# Patient Record
Sex: Male | Born: 1970
Health system: Southern US, Community
[De-identification: ages and names within clinical notes are randomized; demographics above are authoritative.]

## PROBLEM LIST (undated history)

## (undated) DIAGNOSIS — G43909 Migraine, unspecified, not intractable, without status migrainosus: Secondary | ICD-10-CM

## (undated) DIAGNOSIS — I10 Essential (primary) hypertension: Secondary | ICD-10-CM

## (undated) DIAGNOSIS — K649 Unspecified hemorrhoids: Secondary | ICD-10-CM

## (undated) DIAGNOSIS — E785 Hyperlipidemia, unspecified: Secondary | ICD-10-CM

## (undated) DIAGNOSIS — F419 Anxiety disorder, unspecified: Secondary | ICD-10-CM

## (undated) DIAGNOSIS — T7840XA Allergy, unspecified, initial encounter: Secondary | ICD-10-CM

## (undated) HISTORY — DX: Allergy, unspecified, initial encounter: T78.40XA

## (undated) HISTORY — PX: HEMORRHOID BANDING: SHX5850

## (undated) HISTORY — DX: Essential (primary) hypertension: I10

## (undated) HISTORY — DX: Unspecified hemorrhoids: K64.9

## (undated) HISTORY — PX: OTHER SURGICAL HISTORY: SHX169

## (undated) HISTORY — DX: Hyperlipidemia, unspecified: E78.5

## (undated) HISTORY — DX: Anxiety disorder, unspecified: F41.9

## (undated) HISTORY — PX: COLONOSCOPY: SHX174

## (undated) HISTORY — DX: Migraine, unspecified, not intractable, without status migrainosus: G43.909

---

## 2012-09-09 ENCOUNTER — Ambulatory Visit (INDEPENDENT_AMBULATORY_CARE_PROVIDER_SITE_OTHER): Payer: 59 | Admitting: Family Medicine

## 2012-09-09 ENCOUNTER — Encounter: Payer: Self-pay | Admitting: Family Medicine

## 2012-09-09 VITALS — BP 140/100 | HR 68 | Temp 98.3°F | Ht 70.0 in | Wt 189.0 lb

## 2012-09-09 DIAGNOSIS — Z8249 Family history of ischemic heart disease and other diseases of the circulatory system: Secondary | ICD-10-CM

## 2012-09-09 DIAGNOSIS — E785 Hyperlipidemia, unspecified: Secondary | ICD-10-CM | POA: Insufficient documentation

## 2012-09-09 DIAGNOSIS — I10 Essential (primary) hypertension: Secondary | ICD-10-CM

## 2012-09-09 DIAGNOSIS — G43909 Migraine, unspecified, not intractable, without status migrainosus: Secondary | ICD-10-CM

## 2012-09-09 LAB — COMPREHENSIVE METABOLIC PANEL
ALT: 32 U/L (ref 0–53)
CO2: 28 mEq/L (ref 19–32)
Calcium: 9.5 mg/dL (ref 8.4–10.5)
Chloride: 96 mEq/L (ref 96–112)
Creatinine, Ser: 1.3 mg/dL (ref 0.4–1.5)
GFR: 80.81 mL/min (ref >60.00)
Glucose, Bld: 78 mg/dL (ref 70–99)
Total Bilirubin: 0.9 mg/dL (ref 0.3–1.2)

## 2012-09-09 MED ORDER — LISINOPRIL-HYDROCHLOROTHIAZIDE 10-12.5 MG PO TABS: 1.0000 | ORAL_TABLET | Freq: Every day | ORAL | Status: DC

## 2012-09-09 MED ORDER — TOPIRAMATE 25 MG PO TABS: 25.0000 mg | ORAL_TABLET | Freq: Every day | ORAL | Status: DC

## 2012-09-09 NOTE — Progress Notes (Signed)
Subjective:    Patient ID: Micheal Keller, male    DOB: 08-30-1970, 42 y.o.   MRN: [id number]  HPI  Very pleasant 42 yo male here to establish care.  HTN- Has been on HCTZ 12.5 mg daily for years.  Strong FH of HTN.  Lately has been feeling like his face is more flushed and BP has been elevated. No blurred vision.  No CP or SOB.  Migraine HA- with aura, associated with nausea.  Usually gets them once a month but last month was bad- had 4 or 5 between January and February. Imitrex has never worked.  Relpax has been good abortive therapy if he starts it as soon as he notices the auro.  HLD- strong FH of HLD.  Has not had blood work in over a year. On Vytroin.  Denies any myalgias.  Brother diagnosed with WPW in his 61s.  He would like an EKG.  Denies any palpitations, CP or SOB. He is very physically active- cardio and weight training everyday.  Patient Active Problem List  Diagnosis  . Hypertension  . Hyperlipidemia  . Migraine, unspecified, without mention of intractable migraine without mention of status migrainosus  . Family history of cardiac arrhythmia   Past Medical History  Diagnosis Date  . Hypertension   . Hyperlipidemia    No past surgical history on file. History  Substance Use Topics  . Smoking status: Former Games developer  . Smokeless tobacco: Not on file  . Alcohol Use: Not on file   Family History  Problem Relation Age of Onset  . Diabetes Mother   . Hypertension Mother   . Hyperlipidemia Father   . Evelene Croon Parkinson White syndrome Brother   . Bone cancer Sister    No Known Allergies No current outpatient prescriptions on file prior to visit.   No current facility-administered medications on file prior to visit.   The PMH, PSH, Social History, Family History, Medications, and allergies have been reviewed in [facility], and have been updated if relevant.   Review of Systems See HPI Patient reports no  vision/ hearing changes,anorexia, weight change, fever ,adenopathy,  persistant / recurrent hoarseness, swallowing issues, chest pain, edema,persistant / recurrent cough, hemoptysis, dyspnea(rest, exertional, paroxysmal nocturnal)    Objective:   Physical Exam BP 140/100  Pulse 68  Temp(Src) 98.3 F (36.8 C)  Ht 5\' 10"  (1.778 m)  Wt 189 lb (85.73 kg)  BMI 27.12 kg/m2 General:  Pleasant male in NAD Eyes:  PERRL Ears:  External ear exam shows no significant lesions or deformities.  Otoscopic examination reveals clear canals, tympanic membranes are intact bilaterally without bulging, retraction, inflammation or discharge. Hearing is grossly normal bilaterally. Nose:  External nasal examination shows no deformity or inflammation. Nasal mucosa are pink and moist without lesions or exudates. Mouth:  Oral mucosa and oropharynx without lesions or exudates.  Teeth in good repair. Neck:  no carotid bruit or thyromegaly no cervical or supraclavicular lymphadenopathy  Lungs:  Normal respiratory effort, chest expands symmetrically. Lungs are clear to auscultation, no crackles or wheezes. Heart:  Normal rate and regular rhythm. S1 and S2 normal without gallop, murmur, click, rub or other extra sounds. Abdomen:  Bowel sounds positive,abdomen soft and non-tender without masses, organomegaly or hernias noted. Pulses:  R and L posterior tibial pulses are full and equal bilaterally  Extremities:  no edema      Assessment & Plan:  1. Migraine, unspecified, without mention of intractable migraine without mention of status migrainosus Deteriorated.  Just  had dilated eye exam- prescription ok.  Will start on migraine prophylaxis with topamax 25 mg qhs.  Continue Relpax for abortive therapy. - CBC with Differential  2. Family history of cardiac arrhythmia EKG very reassuring- probable repolarization.  Will compare with previous EKG once we get records.   - EKG 12-Lead  3. Hyperlipidemia On Vytorin.   Check labs today. - Lipid Panel  4. Hypertension Not well controlled  and he is symptomatic.  Will add lisinopril 10 mg to HCTZ 12.5 mg daily. - Comprehensive metabolic panel

## 2012-09-09 NOTE — Patient Instructions (Addendum)
It was so nice to meet you. We are starting Topamax 25 mg nightly.   We are also starting a new blood pressure medication that actually is a combination of two medications- HCTZ 12.5 mg (what you are currently taking) with lisinopril 10 mg daily. Please come see me in 2 weeks to recheck your blood pressure.  We will call you with your lab results.

## 2012-09-13 LAB — LIPID PANEL
Cholesterol: 168 mg/dL (ref 0–200)
HDL: 48.8 mg/dL (ref >39.00)

## 2012-09-13 NOTE — Addendum Note (Signed)
Addended by: Alvina Chou on: 09/13/2012 12:41 PM   Modules accepted: Orders

## 2012-09-20 ENCOUNTER — Other Ambulatory Visit (INDEPENDENT_AMBULATORY_CARE_PROVIDER_SITE_OTHER): Payer: 59

## 2012-09-20 DIAGNOSIS — G43909 Migraine, unspecified, not intractable, without status migrainosus: Secondary | ICD-10-CM

## 2012-09-20 DIAGNOSIS — I10 Essential (primary) hypertension: Secondary | ICD-10-CM

## 2012-09-20 LAB — CBC WITH DIFFERENTIAL/PLATELET
Eosinophils Relative: 2.1 % (ref 0.0–5.0)
HCT: 40.2 % (ref 39.0–52.0)
Lymphocytes Relative: 58.6 % — ABNORMAL HIGH (ref 12.0–46.0)
Lymphs Abs: 2.8 10*3/uL (ref 0.7–4.0)
Monocytes Relative: 7.5 % (ref 3.0–12.0)
Platelets: 260 10*3/uL (ref 150.0–400.0)
WBC: 4.9 10*3/uL (ref 4.5–10.5)

## 2012-09-23 ENCOUNTER — Telehealth: Payer: Self-pay

## 2012-09-23 NOTE — Telephone Encounter (Signed)
Pt request call back when recent lab results available.

## 2012-09-24 NOTE — Telephone Encounter (Signed)
Dr. Dayton Martes, please review patient's labs.

## 2012-09-24 NOTE — Telephone Encounter (Signed)
Advised patient of results.  

## 2012-09-24 NOTE — Telephone Encounter (Signed)
CBC, cholesterol, liver function and kidney function look good.

## 2012-10-07 ENCOUNTER — Encounter: Payer: Self-pay | Admitting: Family Medicine

## 2012-10-07 ENCOUNTER — Ambulatory Visit (INDEPENDENT_AMBULATORY_CARE_PROVIDER_SITE_OTHER): Payer: 59 | Admitting: Family Medicine

## 2012-10-07 VITALS — BP 130/70 | HR 60 | Temp 98.1°F | Ht 70.0 in | Wt 185.0 lb

## 2012-10-07 DIAGNOSIS — G43909 Migraine, unspecified, not intractable, without status migrainosus: Secondary | ICD-10-CM

## 2012-10-07 DIAGNOSIS — I10 Essential (primary) hypertension: Secondary | ICD-10-CM

## 2012-10-07 MED ORDER — TOPIRAMATE 25 MG PO TABS: 25.0000 mg | ORAL_TABLET | Freq: Every day | ORAL | Status: DC

## 2012-10-07 NOTE — Progress Notes (Signed)
Subjective:    Patient ID: Micheal Keller, male    DOB: [birth year], 42 y.o.   MRN: [id number]  HPI  Very pleasant 42 yo male here for follow up.  HTN- Had been on HCTZ 12.5 mg daily for years.  Strong FH of HTN.  When he established care with me last month, BP not well controlled and he was symptomatic.  BP Readings from Last 3 Encounters:  09/09/12 140/100   Therefore, we added lisinopril 10 mg to HCTZ 12.5 mg daily.  He feels much better, no longer feels facial flushing.  Normotensive today.  Migraine HA- with aura, associated with nausea. Was getting one migraine per month but when he established care with me last month, had increased frequency-  had 4 or 5 between January and February.   Relpax has been good abortive therapy if he starts it as soon as he notices the auro. Started topamax 25 mg qhs for abortive therapy last month and he has not had a migraine since!  No adverse rx noted.   Patient Active Problem List  Diagnosis  . Hypertension  . Hyperlipidemia  . Migraine, unspecified, without mention of intractable migraine without mention of status migrainosus  . Family history of cardiac arrhythmia   Past Medical History  Diagnosis Date  . Hypertension   . Hyperlipidemia    No past surgical history on file. History  Substance Use Topics  . Smoking status: Former Games developer  . Smokeless tobacco: Not on file  . Alcohol Use: Not on file   Family History  Problem Relation Age of Onset  . Diabetes Mother   . Hypertension Mother   . Hyperlipidemia Father   . Evelene Croon Parkinson White syndrome Brother   . Bone cancer Sister    No Known Allergies Current Outpatient Prescriptions on File Prior to Visit  Medication Sig Dispense Refill  . eletriptan (RELPAX) 40 MG tablet One tablet by mouth at onset of headache. May repeat in 2 hours if headache persists or recurs. may repeat in 2 hours if necessary      . ezetimibe-simvastatin (VYTORIN) 10-10 MG per tablet Take 1 tablet by mouth at  bedtime.      Marland Kitchen lisinopril-hydrochlorothiazide (PRINZIDE,ZESTORETIC) 10-12.5 MG per tablet Take 1 tablet by mouth daily.  90 tablet  3  . promethazine (PHENERGAN) 25 MG tablet Take 25 mg by mouth every 6 (six) hours as needed for nausea.      Marland Kitchen topiramate (TOPAMAX) 25 MG tablet Take 1 tablet (25 mg total) by mouth at bedtime.  30 tablet  1   No current facility-administered medications on file prior to visit.   The PMH, PSH, Social History, Family History, Medications, and allergies have been reviewed in [facility], and have been updated if relevant.   Review of Systems See HPI Patient reports no  vision/ hearing changes,anorexia, weight change, fever ,adenopathy, persistant / recurrent hoarseness, swallowing issues, chest pain, edema,persistant / recurrent cough, hemoptysis, dyspnea(rest, exertional, paroxysmal nocturnal)    Objective:   Physical Exam BP 130/70  Pulse 60  Temp(Src) 98.1 F (36.7 C) (Oral)  Ht 5\' 10"  (1.778 m)  Wt 185 lb (83.915 kg)  BMI 26.54 kg/m2  SpO2 98% General:  Pleasant male in NAD Eyes:  PERRL Mouth:  Oral mucosa and oropharynx without lesions or exudates.  Teeth in good repair. Neck:  no carotid bruit or thyromegaly no cervical or supraclavicular lymphadenopathy  Lungs:  Normal respiratory effort, chest expands symmetrically. Lungs are clear to auscultation, no crackles  or wheezes. Heart:  Normal rate and regular rhythm. S1 and S2 normal without gallop, murmur, click, rub or other extra sounds. Pulses:  R and L posterior tibial pulses are full and equal bilaterally  Extremities:  no edema      Assessment & Plan:  1. Hypertension Well controlled now on two agents. Rx refilled.  2. Migraine, unspecified, without mention of intractable migraine without mention of status migrainosus Well controlled with topamax for prophylaxis.  Continue current dose. Rx refilled.

## 2012-10-07 NOTE — Patient Instructions (Addendum)
Great to see you. I will call Alfredo Bach for old your old EKG. Continue current medications.

## 2012-10-08 ENCOUNTER — Telehealth: Payer: Self-pay | Admitting: *Deleted

## 2012-10-08 NOTE — Telephone Encounter (Signed)
Message copied by Eliezer Bottom on Fri Oct 08, 2012  4:13 PM ------      Message from: Dianne Dun      Created: Fri Oct 08, 2012 11:34 AM       Please call pt to let him know that I reviewed his EKG from [place].  Does not looked changed from the one we did here which is good news. ------

## 2012-10-08 NOTE — Telephone Encounter (Signed)
Advised patient

## 2012-11-17 ENCOUNTER — Ambulatory Visit (INDEPENDENT_AMBULATORY_CARE_PROVIDER_SITE_OTHER): Payer: 59 | Admitting: Family Medicine

## 2012-11-17 ENCOUNTER — Encounter: Payer: Self-pay | Admitting: Family Medicine

## 2012-11-17 VITALS — BP 130/84 | HR 60 | Temp 98.0°F | Wt 185.0 lb

## 2012-11-17 DIAGNOSIS — L290 Pruritus ani: Secondary | ICD-10-CM

## 2012-11-17 MED ORDER — HYDROCORTISONE ACETATE 25 MG RE SUPP: 25.0000 mg | Freq: Every evening | RECTAL | Status: DC | PRN

## 2012-11-17 NOTE — Progress Notes (Signed)
  Subjective:    Patient ID: Micheal Keller, male    DOB: [birth year], 42 y.o.   MRN: [id number]  HPI  Very pleasant male here for anal itching x 1.5 months.  No rectal bleeding. No changes in bowel habits- no constipation or diarrhea.  He does lift weights.  No contact with small children.  No recent international travel.  Symptoms are intermittent.  Patient Active Problem List   Diagnosis Date Noted  . Migraine, unspecified, without mention of intractable migraine without mention of status migrainosus 09/09/2012  . Family history of cardiac arrhythmia 09/09/2012  . Hypertension   . Hyperlipidemia    Past Medical History  Diagnosis Date  . Hypertension   . Hyperlipidemia    No past surgical history on file. History  Substance Use Topics  . Smoking status: Former Games developer  . Smokeless tobacco: Not on file  . Alcohol Use: Not on file   Family History  Problem Relation Age of Onset  . Diabetes Mother   . Hypertension Mother   . Hyperlipidemia Father   . Evelene Croon Parkinson White syndrome Brother   . Bone cancer Sister    No Known Allergies Current Outpatient Prescriptions on File Prior to Visit  Medication Sig Dispense Refill  . eletriptan (RELPAX) 40 MG tablet One tablet by mouth at onset of headache. May repeat in 2 hours if headache persists or recurs. may repeat in 2 hours if necessary      . ezetimibe-simvastatin (VYTORIN) 10-10 MG per tablet Take 1 tablet by mouth at bedtime.      Marland Kitchen lisinopril-hydrochlorothiazide (PRINZIDE,ZESTORETIC) 10-12.5 MG per tablet Take 1 tablet by mouth daily.  90 tablet  3  . promethazine (PHENERGAN) 25 MG tablet Take 25 mg by mouth every 6 (six) hours as needed for nausea.      Marland Kitchen topiramate (TOPAMAX) 25 MG tablet Take 1 tablet (25 mg total) by mouth at bedtime.  90 tablet  6   No current facility-administered medications on file prior to visit.   The PMH, PSH, Social History, Family History, Medications, and allergies have been reviewed in [facility],  and have been updated if relevant.   Review of Systems No abdominal pain No rectal pain    Objective:   Physical Exam BP 130/84  Pulse 60  Temp(Src) 98 F (36.7 C)  Wt 185 lb (83.915 kg)  BMI 26.54 kg/m2  General:  Pleasant male, NAD Abdomen:  Bowel sounds positive,abdomen soft and non-tender without masses, organomegaly or hernias noted. Rectal: normal tone, external hemorrhoid, 12:00 o'clock, non thrombosed Pulses:  R and L posterior tibial pulses are full and equal bilaterally  Extremities:  no edema       Assessment & Plan:  1. Anal itching Likely due to external, non thrombosed hemorrhoid. Given duration of symptoms, will treat with anusol suppositories, sitz baths x 10 days. Will call me with an update. Call or return to clinic prn if these symptoms worsen or fail to improve as anticipated. The patient indicates understanding of these issues and agrees with the plan.

## 2012-11-17 NOTE — Patient Instructions (Addendum)
Good to see you, Micheal Keller. Please use suppositories as directed- take a warm bath and place one rectally nightly for 10-12 days. Call me if no improvement.  Hemorrhoids Hemorrhoids are enlarged (dilated) veins around the rectum. There are 2 types of hemorrhoids, and the type of hemorrhoid is determined by its location. Internal hemorrhoids occur in the veins just inside the rectum.They are usually not painful, but they may bleed.However, they may poke through to the outside and become irritated and painful. External hemorrhoids involve the veins outside the anus and can be felt as a painful swelling or hard lump near the anus.They are often itchy and may crack and bleed. Sometimes clots will form in the veins. This makes them swollen and painful. These are called thrombosed hemorrhoids. CAUSES Causes of hemorrhoids include:  Pregnancy. This increases the pressure in the hemorrhoidal veins.  Constipation.  Straining to have a bowel movement.  Obesity.  Heavy lifting or other activity that caused you to strain. TREATMENT Most of the time hemorrhoids improve in 1 to 2 weeks. However, if symptoms do not seem to be getting better or if you have a lot of rectal bleeding, your caregiver may perform a procedure to help make the hemorrhoids get smaller or remove them completely.Possible treatments include:    STEROID SUPPOSITORIES  Rubber band ligation. A rubber band is placed at the base of the hemorrhoid to cut off the circulation.  Sclerotherapy. A chemical is injected to shrink the hemorrhoid.  Infrared light therapy. Tools are used to burn the hemorrhoid.  Hemorrhoidectomy. This is surgical removal of the hemorrhoid. HOME CARE INSTRUCTIONS   Increase fiber in your diet. Ask your caregiver about using fiber supplements.  Drink enough water and fluids to keep your urine clear or pale yellow.  Exercise regularly.  Go to the bathroom when you have the urge to have a bowel movement. Do  not wait.  Avoid straining to have bowel movements.  Keep the anal area dry and clean.  Only take over-the-counter or prescription medicines for pain, discomfort, or fever as directed by your caregiver. If your hemorrhoids are thrombosed:  Take warm sitz baths for 20 to 30 minutes, 3 to 4 times per day.  If the hemorrhoids are very tender and swollen, place ice packs on the area as tolerated. Using ice packs between sitz baths may be helpful. Fill a plastic bag with ice. Place a towel between the bag of ice and your skin.  Medicated creams and suppositories may be used or applied as directed.  Do not use a donut-shaped pillow or sit on the toilet for long periods. This increases blood pooling and pain. SEEK MEDICAL CARE IF:   You have increasing pain and swelling that is not controlled with your medicine.  You have uncontrolled bleeding.  You have difficulty or you are unable to have a bowel movement.  You have pain or inflammation outside the area of the hemorrhoids.  You have chills or an oral temperature above 102 F (38.9 C). MAKE SURE YOU:   Understand these instructions.  Will watch your condition.  Will get help right away if you are not doing well or get worse. Document Released: 06/20/2000 Document Revised: 09/15/2011 Document Reviewed: 06/03/2010 [facility] Patient Information 2013 [place], [place].

## 2012-12-01 ENCOUNTER — Telehealth: Payer: Self-pay | Admitting: Family Medicine

## 2012-12-01 ENCOUNTER — Encounter: Payer: Self-pay | Admitting: Internal Medicine

## 2012-12-01 DIAGNOSIS — L290 Pruritus ani: Secondary | ICD-10-CM

## 2012-12-01 NOTE — Telephone Encounter (Signed)
Advised patient.   He prefers to see someone in [place].

## 2012-12-01 NOTE — Telephone Encounter (Signed)
Appt made with Dr Leone Payor on 12/03/12 at 2:45pm. [facility]

## 2012-12-01 NOTE — Telephone Encounter (Signed)
Patient Information:  Caller Name: Micheal Keller  Phone: [phone]  Patient: Micheal, Keller  Gender: Male  DOB: Feb 06, 1971  Age: 42 Years  PCP: Micheal Keller [facility])  Office Follow Up:  Does the office need to follow up with this patient?: Yes  Instructions For The Office: Please follow up with patient for continued rectal itching after treatment.  Was seen in office on 11/17/12.  RN Note:  Patient was in on 11/17/12 for anal itching which maybe aggravated by hemorrhoids.  Patient states that he is essentially the same as he was when seen.  There may be a slight increase in rectal soreness.  No change in ability to pass stools.  He calls to see what else can be done for him.  RN stated she would forward his concerns to Dr. Dayton Keller with someone following up with him  Symptoms  Reason For Call & Symptoms: Patient call because after treatment he is not better. Is wondering if something else needs to be prescribed.   Office visit on 11/17/12  Reviewed Health History In EMR: N/A  Reviewed Medications In EMR: N/A  Reviewed Allergies In EMR: N/A  Reviewed Surgeries / Procedures: N/A  Date of Onset of Symptoms: 11/17/2012  Guideline(s) Used:  No Protocol Available - Information Only  Disposition Per Guideline:   Discuss with PCP and Callback by Nurse Today  Reason For Disposition Reached:   Nursing judgment  Advice Given:  Call Back If:  New symptoms develop  You become worse.  Patient Will Follow Care Advice:  YES

## 2012-12-01 NOTE — Telephone Encounter (Signed)
Since there is no improvement, I would recommend seeing GI at this point given duration of symptoms. Will place referral.

## 2012-12-03 ENCOUNTER — Encounter: Payer: Self-pay | Admitting: Internal Medicine

## 2012-12-03 ENCOUNTER — Ambulatory Visit (INDEPENDENT_AMBULATORY_CARE_PROVIDER_SITE_OTHER): Payer: 59 | Admitting: Internal Medicine

## 2012-12-03 VITALS — BP 118/82 | HR 80 | Ht 70.0 in | Wt 186.8 lb

## 2012-12-03 DIAGNOSIS — K648 Other hemorrhoids: Secondary | ICD-10-CM

## 2012-12-03 DIAGNOSIS — K5900 Constipation, unspecified: Secondary | ICD-10-CM

## 2012-12-03 DIAGNOSIS — L290 Pruritus ani: Secondary | ICD-10-CM

## 2012-12-03 NOTE — Patient Instructions (Addendum)
We have set up an appointment to come back on December 16 2012 at 4:15pm for the hemorrhoid banding.  Start Fiber, we have given you an information sheet on that today.  Start Balneol for the rectal itching.  This is over the counter.  [facility] has this.  I appreciate the opportunity to care for you.

## 2012-12-03 NOTE — Progress Notes (Signed)
Subjective:  Referred by: Dianne Dun, MD   Patient ID: [name], male    DOB: March 20, 1971, 42 y.o.   MRN: [id number]  HPI Is a very nice middle-aged [place] American man has had a 1-2 month history of anal itching. He saw his primary care physician and she saw what she thought were external hemorrhoids and prescribed hydrocortisone suppositories. He had previously tried Preparation H which did not help. He does have constipation, Intermittent defecation with straining at times, he will get the urge to defecate but cannot produce a stool so he'll wait up to 15 minutes on the toilet until he can. He does not notice anything bulging or dropping such as prolapsing hemorrhoids and he does not have rectal bleeding. He does see spots of mucoid material in his underwear in the anal area He is a weight lifter.  No Known Allergies Outpatient Prescriptions Prior to Visit  Medication Sig Dispense Refill  . eletriptan (RELPAX) 40 MG tablet One tablet by mouth at onset of headache. May repeat in 2 hours if headache persists or recurs. may repeat in 2 hours if necessary      . ezetimibe-simvastatin (VYTORIN) 10-10 MG per tablet Take 1 tablet by mouth at bedtime.      Marland Kitchen lisinopril-hydrochlorothiazide (PRINZIDE,ZESTORETIC) 10-12.5 MG per tablet Take 1 tablet by mouth daily.  90 tablet  3  . promethazine (PHENERGAN) 25 MG tablet Take 25 mg by mouth every 6 (six) hours as needed for nausea.      Marland Kitchen topiramate (TOPAMAX) 25 MG tablet Take 1 tablet (25 mg total) by mouth at bedtime.  90 tablet  6  . hydrocortisone (ANUSOL-HC) 25 MG suppository Place 1 suppository (25 mg total) rectally at bedtime as needed for hemorrhoids.  12 suppository  0   No facility-administered medications prior to visit.   Past Medical History  Diagnosis Date  . Hypertension   . Hyperlipidemia   . Migraines   . Hemorrhoids    History reviewed. No pertinent past surgical history. History   Social History  . Marital Status: Married                  Social History Main Topics  . Smoking status: Former Games developer  . Smokeless tobacco: Never Used  . Alcohol Use: Yes     Comment: occasionally  . Drug Use: No   Family History  Problem Relation Age of Onset  . Diabetes Mother   . Hypertension Mother   . Hyperlipidemia Father   . Evelene Croon Parkinson White syndrome Brother   . Bone cancer Sister    Review of Systems As per history of present illness all other review of systems negative    Objective:   Physical Exam General:  NAD middle-aged African American man Eyes:   anicteric Lungs:  clear Heart:  S1S2 no rubs, murmurs or gallops Abdomen:  soft and nontender, BS+ Ext:   no edema  Rectal exam shows some external skin tags in the right anterior and left lateral positions. There is no perianal dermatitis. Male staff chaperone present, digital exam shows normal resting tone no mass normal prostate.  Anoscopy is performed after explanation of the procedure to the patient. He does have prominent internal hemorrhoids, most prominent in the right anterior position. Air inflamed and swollen.    Data Reviewed:  PCP notes     Assessment & Plan:   1. Internal hemorrhoids   2. Pruritus ani   3. Constipation  The clinical scenario  here is that of internal hemorrhoids leaking mucus causing the prudent pruritus ani. I've explained that this is a plausible and possible explanation for his problems and that treatment with hemorrhoidal ligation can resolve this. In the meantime he will add fiber supplementation for his constipation using Benefiber or generic equivalent 2 tablespoons daily. He will try to avoid sitting in straining to stool. Balneol clenser to be used also.  He will return June 12 for hemorrhoidal ligation #1 - risks and benefits explained including risks of bleeding and pain which are < 1%. Plan will be for 3 separate bandings about 2 weeks apart, less if ok after 1-2 but I have explained that it usually  takes banding of all 3 columns.   I appreciate the opportunity to care for this patient.  ZO:[id number] Dayton Martes, MD

## 2012-12-13 ENCOUNTER — Other Ambulatory Visit: Payer: Self-pay | Admitting: Family Medicine

## 2012-12-13 NOTE — Telephone Encounter (Signed)
Pt left v/m requesting refill on vytorin 10/10.

## 2012-12-16 ENCOUNTER — Ambulatory Visit (INDEPENDENT_AMBULATORY_CARE_PROVIDER_SITE_OTHER): Payer: 59 | Admitting: Internal Medicine

## 2012-12-16 VITALS — BP 106/68 | HR 63 | Ht 70.0 in | Wt 186.0 lb

## 2012-12-16 DIAGNOSIS — K648 Other hemorrhoids: Secondary | ICD-10-CM

## 2012-12-16 DIAGNOSIS — K649 Unspecified hemorrhoids: Secondary | ICD-10-CM

## 2012-12-16 NOTE — Patient Instructions (Signed)
HEMORRHOID BANDING PROCEDURE    FOLLOW-UP CARE   1. The procedure you have had should have been relatively painless since the banding of the area involved does not have nerve endings and there is no pain sensation.  The rubber band cuts off the blood supply to the hemorrhoid and the band may fall off as soon as 48 hours after the banding (the band may occasionally be seen in the toilet bowl following a bowel movement). You may notice a temporary feeling of fullness in the rectum which should respond adequately to plain Tylenol or Motrin.  2. Following the banding, avoid strenuous exercise that evening and resume full activity the next day.  A sitz bath (soaking in a warm tub) or bidet is soothing, and can be useful for cleansing the area after bowel movements.     3. To avoid constipation, take two tablespoons of natural wheat bran, natural oat bran, flax, Benefiber or any over the counter fiber supplement and increase your water intake to 7-8 glasses daily.    4. Unless you have been prescribed anorectal medication, do not put anything inside your rectum for two weeks: No suppositories, enemas, fingers, etc.  5. Occasionally, you may have more bleeding than usual after the banding procedure.  This is often from the untreated hemorrhoids rather than the treated one.  Don't be concerned if there is a tablespoon or so of blood.  If there is more blood than this, lie flat with your bottom higher than your head and apply an ice pack to the area. If the bleeding does not stop within a half an hour or if you feel faint, call our office at [phone] or go to the emergency room.  6. Problems are not common; however, if there is a substantial amount of bleeding, severe pain, chills, fever or difficulty passing urine (very rare) or other problems, you should call us at [phone] or report to the nearest emergency room.  7. Do not stay seated continuously for more than 2-3 hours for a day or two  after the procedure.  Tighten your buttock muscles 10-15 times every two hours and take 10-15 deep breaths every 1-2 hours.  Do not spend more than a few minutes on the toilet if you cannot empty your bowel; instead re-visit the toilet at a later time.      I appreciate the opportunity to care for you.         

## 2012-12-16 NOTE — Progress Notes (Signed)
Patient ID: Micheal Keller, male   DOB: Apr 12, 1971, 42 y.o.   MRN: [id number]  PROCEDURE NOTE: The patient presents with symptomatic grade 1 hemorrhoids, unresponsive to maximal medical therapy, requesting rubber band ligation of his/her hemorrhoidal disease.  All risks, benefits and alternative forms of therapy were described and informed consent was obtained.  In the Left Lateral Decubitus position the decision was made to band the RA internal hemorrhoid, and the [facility] O'Regan System was used to perform band ligation without complication.  Digital anorectal examination was then performed to assure proper positioning of the band, and to adjust the banded tissue as required.  The patient was discharged home without pain or other issues.  Dietary and behavioral recommendations were given and (if necessary - prescriptions were given), along with follow-up instructions.  The patient will return in 3-4 weeks or as needed for follow-up and possible additional banding as required. No complications were encountered and the patient tolerated the procedure well.

## 2013-01-11 ENCOUNTER — Encounter: Payer: Self-pay | Admitting: Internal Medicine

## 2013-01-11 ENCOUNTER — Ambulatory Visit (INDEPENDENT_AMBULATORY_CARE_PROVIDER_SITE_OTHER): Payer: 59 | Admitting: Internal Medicine

## 2013-01-11 VITALS — BP 108/68 | HR 76 | Ht 70.0 in | Wt 188.0 lb

## 2013-01-11 DIAGNOSIS — K648 Other hemorrhoids: Secondary | ICD-10-CM

## 2013-01-11 NOTE — Patient Instructions (Addendum)
HEMORRHOID BANDING PROCEDURE    FOLLOW-UP CARE   1. The procedure you have had should have been relatively painless since the banding of the area involved does not have nerve endings and there is no pain sensation.  The rubber band cuts off the blood supply to the hemorrhoid and the band may fall off as soon as 48 hours after the banding (the band may occasionally be seen in the toilet bowl following a bowel movement). You may notice a temporary feeling of fullness in the rectum which should respond adequately to plain Tylenol or Motrin.  2. Following the banding, avoid strenuous exercise that evening and resume full activity the next day.  A sitz bath (soaking in a warm tub) or bidet is soothing, and can be useful for cleansing the area after bowel movements.     3. To avoid constipation, take two tablespoons of natural wheat bran, natural oat bran, flax, Benefiber or any over the counter fiber supplement and increase your water intake to 7-8 glasses daily.    4. Unless you have been prescribed anorectal medication, do not put anything inside your rectum for two weeks: No suppositories, enemas, fingers, etc.  5. Occasionally, you may have more bleeding than usual after the banding procedure.  This is often from the untreated hemorrhoids rather than the treated one.  Don't be concerned if there is a tablespoon or so of blood.  If there is more blood than this, lie flat with your bottom higher than your head and apply an ice pack to the area. If the bleeding does not stop within a half an hour or if you feel faint, call our office at [phone] or go to the emergency room.  6. Problems are not common; however, if there is a substantial amount of bleeding, severe pain, chills, fever or difficulty passing urine (very rare) or other problems, you should call us at [phone] or report to the nearest emergency room.  7. Do not stay seated continuously for more than 2-3 hours for a day or two  after the procedure.  Tighten your buttock muscles 10-15 times every two hours and take 10-15 deep breaths every 1-2 hours.  Do not spend more than a few minutes on the toilet if you cannot empty your bowel; instead re-visit the toilet at a later time.    We have made your next banding appointment for July 24th 2014.   I appreciate the opportunity to care for you.

## 2013-01-11 NOTE — Progress Notes (Signed)
Patient ID: Micheal Keller, male   DOB: [birth year], 42 y.o.   MRN: [id number]   PROCEDURE NOTE: The patient presents with symptomatic grade 1 hemorrhoids, requesting rubber band ligation of his/her hemorrhoidal disease.  All risks, benefits and alternative forms of therapy were described and informed consent was obtained.  He reports reduced anal itching after initial ligation of RA column previously.  The decision was made to band the LL internal hemorrhoid, and the [facility] O'Regan System was used to perform band ligation without complication.  Digital anorectal examination was then performed to assure proper positioning of the band, and to adjust the banded tissue as required.  The patient was discharged home without pain or other issues. The patient will return in about 2  Weeks for follow-up and possible additional banding as required. No complications were encountered and the patient tolerated the procedure well.

## 2013-01-27 ENCOUNTER — Ambulatory Visit (INDEPENDENT_AMBULATORY_CARE_PROVIDER_SITE_OTHER): Payer: 59 | Admitting: Internal Medicine

## 2013-01-27 ENCOUNTER — Encounter: Payer: Self-pay | Admitting: Internal Medicine

## 2013-01-27 VITALS — BP 120/80 | HR 60 | Ht 70.0 in | Wt 184.2 lb

## 2013-01-27 DIAGNOSIS — K648 Other hemorrhoids: Secondary | ICD-10-CM

## 2013-01-27 NOTE — Patient Instructions (Signed)
Follow up with Dr. Leone Payor in 2 months  HEMORRHOID BANDING PROCEDURE    FOLLOW-UP CARE   1. The procedure you have had should have been relatively painless since the banding of the area involved does not have nerve endings and there is no pain sensation.  The rubber band cuts off the blood supply to the hemorrhoid and the band may fall off as soon as 48 hours after the banding (the band may occasionally be seen in the toilet bowl following a bowel movement). You may notice a temporary feeling of fullness in the rectum which should respond adequately to plain Tylenol or Motrin.  2. Following the banding, avoid strenuous exercise that evening and resume full activity the next day.  A sitz bath (soaking in a warm tub) or bidet is soothing, and can be useful for cleansing the area after bowel movements.     3. To avoid constipation, take two tablespoons of natural wheat bran, natural oat bran, flax, Benefiber or any over the counter fiber supplement and increase your water intake to 7-8 glasses daily.    4. Unless you have been prescribed anorectal medication, do not put anything inside your rectum for two weeks: No suppositories, enemas, fingers, etc.  5. Occasionally, you may have more bleeding than usual after the banding procedure.  This is often from the untreated hemorrhoids rather than the treated one.  Don't be concerned if there is a tablespoon or so of blood.  If there is more blood than this, lie flat with your bottom higher than your head and apply an ice pack to the area. If the bleeding does not stop within a half an hour or if you feel faint, call our office at [phone] or go to the emergency room.  6. Problems are not common; however, if there is a substantial amount of bleeding, severe pain, chills, fever or difficulty passing urine (very rare) or other problems, you should call us at [phone] or report to the nearest emergency room.  7. Do not stay seated  continuously for more than 2-3 hours for a day or two after the procedure.  Tighten your buttock muscles 10-15 times every two hours and take 10-15 deep breaths every 1-2 hours.  Do not spend more than a few minutes on the toilet if you cannot empty your bowel; instead re-visit the toilet at a later time.

## 2013-01-27 NOTE — Progress Notes (Signed)
Patient ID: Micheal Keller, male   DOB: 10/16/1970, 42 y.o.   MRN: [id number] PROCEDURE NOTE: The patient presents with symptomatic grade 1 hemorrhoids, requesting rubber band ligation of his hemorrhoidal disease.  All risks, benefits and alternative forms of therapy were described and informed consent was obtained. He reports 70% reduction in anal itching after first 2   The decision was made to band the RP internal hemorrhoid, and the [facility] O'Regan System was used to perform band ligation without complication.  Digital anorectal examination was then performed to assure proper positioning of the band, and to adjust the banded tissue as required.  The patient was discharged home without pain or other issues.   The patient will return in 2 months  for follow-up and possible additional banding as required. No complications were encountered and the patient tolerated the procedure well.

## 2013-02-08 ENCOUNTER — Other Ambulatory Visit: Payer: Self-pay

## 2013-02-08 MED ORDER — EZETIMIBE-SIMVASTATIN 10-10 MG PO TABS: ORAL_TABLET | ORAL | Status: DC

## 2013-02-08 NOTE — Telephone Encounter (Signed)
Pt request refill vytorin 10-10 #90 x 1 to [facility]. Pt notified done.

## 2013-03-15 ENCOUNTER — Telehealth: Payer: Self-pay

## 2013-03-15 ENCOUNTER — Telehealth: Payer: Self-pay | Admitting: Internal Medicine

## 2013-03-15 NOTE — Telephone Encounter (Signed)
He sees me next week

## 2013-03-15 NOTE — Telephone Encounter (Signed)
I am unaware of anal itching as side effect of either of those medications.

## 2013-03-15 NOTE — Telephone Encounter (Signed)
Patient having continued rectal itching.  He states that the Balenol lotion only helps for about 1 hour.  Is the anything else we can send in?

## 2013-03-15 NOTE — Telephone Encounter (Signed)
Pt was seen in 11/2012 for anal itching; pt also saw Dr Leone Payor, pt had hemorrhoids banded and has f/u appt with Dr Leone Payor on 03/28/13. Pt wonders if anal itching could be related to taking Topamax or Vytorin. Pt said he has anal itching nightly. No pain and no bleeding.[facility]Please advise.

## 2013-03-15 NOTE — Telephone Encounter (Signed)
Advised patient

## 2013-03-15 NOTE — Telephone Encounter (Signed)
Avoid coffee, cola, caffeine and citrus  Try calmoseptine ointment tid-qid  If that fails see me back

## 2013-03-15 NOTE — Telephone Encounter (Signed)
Patient notified

## 2013-03-28 ENCOUNTER — Ambulatory Visit (INDEPENDENT_AMBULATORY_CARE_PROVIDER_SITE_OTHER): Payer: 59 | Admitting: Internal Medicine

## 2013-03-28 ENCOUNTER — Encounter: Payer: Self-pay | Admitting: Internal Medicine

## 2013-03-28 VITALS — BP 110/62 | HR 60 | Ht 71.0 in | Wt 187.0 lb

## 2013-03-28 DIAGNOSIS — L290 Pruritus ani: Secondary | ICD-10-CM

## 2013-03-28 DIAGNOSIS — K648 Other hemorrhoids: Secondary | ICD-10-CM

## 2013-03-28 NOTE — Assessment & Plan Note (Addendum)
Improved. To continue calmoseptine. Consider repeat ligation of hemorrhoids at some point if desired.

## 2013-03-28 NOTE — Assessment & Plan Note (Addendum)
Improved but not resolved. He will continue calmoseptine and consider repeat banding at some point if desired.

## 2013-03-28 NOTE — Patient Instructions (Addendum)
Continue current treatment and plans, follow up with [place] as needed.   We will put in a recall for age 40.  I appreciate the opportunity to care for you.

## 2013-03-28 NOTE — Progress Notes (Signed)
  Subjective:    Patient ID: Micheal Keller, male    DOB: 06-17-1971, 42 y.o.   MRN: [id number]  HPI [name] is here 2 months after last banding of hemorrhoids for anal itching. He had improvement but never had resolution. Balneol was tried. Since then, I recommended Calmoseptine ointment and that helps - relives sxs completely during day but has some at night. Medications, allergies, past medical history, past surgical history, family history and social history are reviewed and updated in the EMR.   Review of Systems As above    Objective:   Physical Exam WDWN NAD Anal inspection shows small tags with slight fecal material, no rash or dermatitis     Assessment & Plan:  Anal itching  Internal hemorrhoids with anal itching  See problem-oriented charting. Will also place recall for colonoscopy at 47 (African-American)

## 2013-05-12 ENCOUNTER — Other Ambulatory Visit: Payer: Self-pay

## 2013-07-13 ENCOUNTER — Encounter: Payer: Self-pay | Admitting: Family Medicine

## 2013-07-13 ENCOUNTER — Ambulatory Visit (INDEPENDENT_AMBULATORY_CARE_PROVIDER_SITE_OTHER): Payer: 59 | Admitting: Family Medicine

## 2013-07-13 VITALS — BP 126/78 | HR 52 | Temp 98.2°F | Ht 70.0 in | Wt 197.8 lb

## 2013-07-13 DIAGNOSIS — I10 Essential (primary) hypertension: Secondary | ICD-10-CM

## 2013-07-13 DIAGNOSIS — G43909 Migraine, unspecified, not intractable, without status migrainosus: Secondary | ICD-10-CM

## 2013-07-13 DIAGNOSIS — E785 Hyperlipidemia, unspecified: Secondary | ICD-10-CM

## 2013-07-13 DIAGNOSIS — J309 Allergic rhinitis, unspecified: Secondary | ICD-10-CM

## 2013-07-13 LAB — COMPREHENSIVE METABOLIC PANEL
ALBUMIN: 4.6 g/dL (ref 3.5–5.2)
ALK PHOS: 46 U/L (ref 39–117)
ALT: 31 U/L (ref 0–53)
AST: 31 U/L (ref 0–37)
BUN: 18 mg/dL (ref 6–23)
CALCIUM: 9.3 mg/dL (ref 8.4–10.5)
CO2: 29 mEq/L (ref 19–32)
CREATININE: 1.1 mg/dL (ref 0.4–1.5)
Chloride: 102 mEq/L (ref 96–112)
GFR: 93.03 mL/min (ref >60.00)
GLUCOSE: 95 mg/dL (ref 70–99)
POTASSIUM: 3.8 meq/L (ref 3.5–5.1)
Sodium: 138 mEq/L (ref 135–145)
Total Bilirubin: 1 mg/dL (ref 0.3–1.2)
Total Protein: 7.5 g/dL (ref 6.0–8.3)

## 2013-07-13 LAB — LIPID PANEL
CHOLESTEROL: 162 mg/dL (ref 0–200)
HDL: 49.9 mg/dL (ref >39.00)
LDL CALC: 103 mg/dL — AB (ref 0–99)
TRIGLYCERIDES: 46 mg/dL (ref 0.0–149.0)
Total CHOL/HDL Ratio: 3
VLDL: 9.2 mg/dL (ref 0.0–40.0)

## 2013-07-13 MED ORDER — EZETIMIBE-SIMVASTATIN 10-10 MG PO TABS
ORAL_TABLET | ORAL | Status: DC
Start: 2013-07-13 — End: 2014-02-13

## 2013-07-13 MED ORDER — ELETRIPTAN HYDROBROMIDE 40 MG PO TABS: 40.0000 mg | ORAL_TABLET | ORAL | Status: DC | PRN

## 2013-07-13 MED ORDER — LISINOPRIL-HYDROCHLOROTHIAZIDE 10-12.5 MG PO TABS: 1.0000 | ORAL_TABLET | Freq: Every day | ORAL | Status: DC

## 2013-07-13 MED ORDER — TOPIRAMATE 50 MG PO TABS: 50.0000 mg | ORAL_TABLET | Freq: Every day | ORAL | Status: DC

## 2013-07-13 NOTE — Assessment & Plan Note (Signed)
Well controlled on current rx. No changes. 

## 2013-07-13 NOTE — Assessment & Plan Note (Signed)
Deteriorated. Increase Topamax to 50 mg nightly. He is aware of possible side effects with increase.  He will update me with his symptoms. Continue Relpax for abortive therapy.

## 2013-07-13 NOTE — Patient Instructions (Signed)
Good to see you. We will call you with your lab results.  Let's increase your topamax to 50 mg nightly.  You can try a daily claritin, allegra or zyrtec.

## 2013-07-13 NOTE — Progress Notes (Signed)
  Subjective:    Patient ID: Micheal Keller, male    DOB: 02/26/1971, 43 y.o.   MRN: [id number]  HPI  Very pleasant 43 yo male here for "med refills."    HTN- Taking lisinopril 10 mg to HCTZ 12.5 mg daily. Strong FH of HTN.   BP Readings from Last 3 Encounters:  07/13/13 126/78  03/28/13 110/62  01/27/13 120/80   Migraine HA- topamax 25 mg qhs for prophylaxis and Relplax for abortive therapy. Initially worked very well- only had one migraine over the summer.  Past few months, having 2 migraines per month.  Associated with aura, associated with nausea. Work has been more stressful.  Nasal drainage x 1 month.  No sinus pressure or fevers.  No cough or SOB.  Patient Active Problem List   Diagnosis Date Noted  . Internal hemorrhoids with anal itching 01/11/2013  . Anal itching 11/17/2012  . Migraine, unspecified, without mention of intractable migraine without mention of status migrainosus 09/09/2012  . Family history of cardiac arrhythmia 09/09/2012  . Hypertension   . Hyperlipidemia    Past Medical History  Diagnosis Date  . Hypertension   . Hyperlipidemia   . Migraines   . Hemorrhoids    Past Surgical History  Procedure Laterality Date  . Hemorrhoid banding     History  Substance Use Topics  . Smoking status: Former Research scientist (life sciences)  . Smokeless tobacco: Never Used  . Alcohol Use: Yes     Comment: occasionally   Family History  Problem Relation Age of Onset  . Diabetes Mother   . Hypertension Mother   . Hyperlipidemia Father   . [name] Parkinson White syndrome Brother   . Bone cancer Sister    No Known Allergies Current Outpatient Prescriptions on File Prior to Visit  Medication Sig Dispense Refill  . Menthol-Zinc Oxide (CALMOSEPTINE) 0.44-20.625 % OINT Apply 1 application topically 2 (two) times daily.      . promethazine (PHENERGAN) 25 MG tablet Take 25 mg by mouth every 6 (six) hours as needed for nausea.      Marland Kitchen topiramate (TOPAMAX) 25 MG tablet Take 1 tablet (25 mg total)  by mouth at bedtime.  90 tablet  6   No current facility-administered medications on file prior to visit.   The PMH, PSH, Social History, Family History, Medications, and allergies have been reviewed in [facility], and have been updated if relevant.   Review of Systems See HPI Patient reports no  vision/ hearing changes,anorexia, weight change, fever ,adenopathy, persistant / recurrent hoarseness, swallowing issues, chest pain, edema,persistant / recurrent cough, hemoptysis, dyspnea(rest, exertional, paroxysmal nocturnal)    Objective:   Physical Exam BP 126/78  Pulse 52  Temp(Src) 98.2 F (36.8 C) (Oral)  Ht 5\' 10"  (1.778 m)  Wt 197 lb 12.8 oz (89.721 kg)  BMI 28.38 kg/m2  SpO2 99% General:  Pleasant male in NAD HEENT:  +nasal mucosal erythema, sinuses NTTP Mouth:  Oral mucosa and oropharynx without lesions or exudates.  Teeth in good repair. Neck:  no carotid bruit or thyromegaly no cervical or supraclavicular lymphadenopathy  Lungs:  Normal respiratory effort, chest expands symmetrically. Lungs are clear to auscultation, no crackles or wheezes. Heart:  Normal rate and regular rhythm. S1 and S2 normal without gallop, murmur, click, rub or other extra sounds. Pulses:  R and L posterior tibial pulses are full and equal bilaterally  Extremities:  no edema      Assessment & Plan:

## 2013-07-13 NOTE — Assessment & Plan Note (Signed)
New -discussed tx. Antihistamine without sudafed. Call or return to clinic prn if these symptoms worsen or fail to improve as anticipated. The patient indicates understanding of these issues and agrees with the plan.

## 2013-07-13 NOTE — Progress Notes (Signed)
Pre-visit discussion using our clinic review tool. No additional management support is needed unless otherwise documented below in the visit note.  

## 2013-07-13 NOTE — Assessment & Plan Note (Signed)
Has been well controlled on current dose of Vytorin. Check labs today. Orders Placed This Encounter  Procedures  . Comprehensive metabolic panel  . Lipid panel

## 2013-07-14 ENCOUNTER — Telehealth: Payer: Self-pay | Admitting: Family Medicine

## 2013-07-14 NOTE — Telephone Encounter (Signed)
Relevant patient education assigned to patient using Emmi. ° °

## 2013-09-15 ENCOUNTER — Encounter: Payer: Self-pay | Admitting: Family Medicine

## 2013-09-15 ENCOUNTER — Ambulatory Visit (INDEPENDENT_AMBULATORY_CARE_PROVIDER_SITE_OTHER): Payer: 59 | Admitting: Family Medicine

## 2013-09-15 VITALS — BP 124/80 | HR 72 | Temp 98.2°F | Wt 194.0 lb

## 2013-09-15 DIAGNOSIS — N4889 Other specified disorders of penis: Secondary | ICD-10-CM | POA: Insufficient documentation

## 2013-09-15 DIAGNOSIS — N489 Disorder of penis, unspecified: Secondary | ICD-10-CM

## 2013-09-15 NOTE — Progress Notes (Signed)
   Subjective:   Patient ID: Micheal Keller, male    DOB: 05/13/1971, 43 y.o.   MRN: [id number]  Jullian Clayson is a pleasant 43 y.o. year old male who presents to clinic today with genital pain  on 09/15/2013  HPI: Past month- on and off dull pain (3/10) at base of glans penis.  Has not noticed any discharge or dysuria. No genital lesions.  No back pain.  No hematuria.  Sexually active with his wife, not using anything for protection.  Patient Active Problem List   Diagnosis Date Noted  . Penile pain 09/15/2013  . Allergic rhinitis 07/13/2013  . Internal hemorrhoids with anal itching 01/11/2013  . Anal itching 11/17/2012  . Migraine, unspecified, without mention of intractable migraine without mention of status migrainosus 09/09/2012  . Family history of cardiac arrhythmia 09/09/2012  . Hypertension   . Hyperlipidemia    Past Medical History  Diagnosis Date  . Hypertension   . Hyperlipidemia   . Migraines   . Hemorrhoids    Past Surgical History  Procedure Laterality Date  . Hemorrhoid banding     History  Substance Use Topics  . Smoking status: Former Research scientist (life sciences)  . Smokeless tobacco: Never Used  . Alcohol Use: Yes     Comment: occasionally   Family History  Problem Relation Age of Onset  . Diabetes Mother   . Hypertension Mother   . Hyperlipidemia Father   . [name] Parkinson White syndrome Brother   . Bone cancer Sister    No Known Allergies Current Outpatient Prescriptions on File Prior to Visit  Medication Sig Dispense Refill  . eletriptan (RELPAX) 40 MG tablet Take 1 tablet (40 mg total) by mouth as needed for migraine. may repeat in 2 hours if necessary  10 tablet  1  . ezetimibe-simvastatin (VYTORIN) 10-10 MG per tablet TAKE 1 TABLET ONCE A DAY ORALLY  90 tablet  1  . lisinopril-hydrochlorothiazide (PRINZIDE,ZESTORETIC) 10-12.5 MG per tablet Take 1 tablet by mouth daily.  90 tablet  1  . Menthol-Zinc Oxide (CALMOSEPTINE) 0.44-20.625 % OINT Apply 1 application topically  2 (two) times daily.      . promethazine (PHENERGAN) 25 MG tablet Take 25 mg by mouth every 6 (six) hours as needed for nausea.      Marland Kitchen topiramate (TOPAMAX) 50 MG tablet Take 1 tablet (50 mg total) by mouth at bedtime.  90 tablet  6   No current facility-administered medications on file prior to visit.   The PMH, PSH, Social History, Family History, Medications, and allergies have been reviewed in [facility], and have been updated if relevant.   Review of Systems See HPI Currently NOT feeling the discomfort    Objective:    BP 124/80  Pulse 72  Temp(Src) 98.2 F (36.8 C) (Oral)  Wt 194 lb (87.998 kg)  SpO2 98%   Physical Exam  General:  pleasant male in NAD Abdomen:  Bowel sounds positive,abdomen soft and non-tender without masses, organomegaly or hernias noted. Genitalia:  Testes bilaterally descended without nodularity, tenderness or masses. No scrotal masses or lesions. No penis lesions or urethral discharge. Extremities:  no edema        Assessment & Plan:   Penile pain - Plan: GC/chlamydia probe amp, urine, HIV antibody, RPR, HSV(herpes simplex vrs) 1+2 ab-IgM No Follow-up on file.

## 2013-09-15 NOTE — Assessment & Plan Note (Signed)
Benign exam. Will check for STDs today. If neg refer to urology. Orders Placed This Encounter  Procedures  . GC/chlamydia probe amp, urine  . HIV antibody  . RPR  . HSV(herpes simplex vrs) 1+2 ab-IgM

## 2013-09-15 NOTE — Progress Notes (Signed)
Pre visit review using our clinic review tool, if applicable. No additional management support is needed unless otherwise documented below in the visit note. 

## 2013-09-15 NOTE — Patient Instructions (Signed)
Good to see you. We will call you with your lab results as soon as we get them.

## 2013-09-16 LAB — GC/CHLAMYDIA PROBE AMP, URINE
Chlamydia, Swab/Urine, PCR: NEGATIVE
GC Probe Amp, Urine: NEGATIVE

## 2013-09-16 LAB — HSV(HERPES SIMPLEX VRS) I + II AB-IGM: Herpes Simplex Vrs I&II-IgM Ab (EIA): 0.32 INDEX

## 2013-09-16 LAB — HIV ANTIBODY (ROUTINE TESTING W REFLEX): HIV: NONREACTIVE

## 2013-09-16 LAB — RPR

## 2013-09-16 NOTE — Addendum Note (Signed)
Addended by: Lucille Passy on: 09/16/2013 12:20 PM   Modules accepted: Orders

## 2014-02-13 ENCOUNTER — Other Ambulatory Visit: Payer: Self-pay | Admitting: *Deleted

## 2014-02-13 MED ORDER — EZETIMIBE-SIMVASTATIN 10-10 MG PO TABS: ORAL_TABLET | ORAL | Status: DC

## 2014-02-13 NOTE — Telephone Encounter (Signed)
Spoke to pt who scheduled f/u appt; #90 sent to requested pharmacy

## 2014-02-13 NOTE — Telephone Encounter (Signed)
Lm on pts vm informing him an Ov with labs is required. Attempting to obtain which local pharmacy he is wanting #30 sent to as he is unable to receive #90

## 2014-02-15 ENCOUNTER — Other Ambulatory Visit: Payer: Self-pay

## 2014-02-15 MED ORDER — EZETIMIBE-SIMVASTATIN 10-10 MG PO TABS: ORAL_TABLET | ORAL | Status: DC

## 2014-02-15 NOTE — Telephone Encounter (Signed)
Pt going out of town today and will not get mail order med until return. Request 1 week of vytorin to [facility]. Vaughan Basta T advised pt done.

## 2014-02-17 ENCOUNTER — Ambulatory Visit (INDEPENDENT_AMBULATORY_CARE_PROVIDER_SITE_OTHER): Payer: 59 | Admitting: Family Medicine

## 2014-02-17 ENCOUNTER — Encounter: Payer: Self-pay | Admitting: Family Medicine

## 2014-02-17 VITALS — BP 114/70 | HR 71 | Temp 98.4°F | Wt 197.0 lb

## 2014-02-17 DIAGNOSIS — F4323 Adjustment disorder with mixed anxiety and depressed mood: Secondary | ICD-10-CM | POA: Insufficient documentation

## 2014-02-17 DIAGNOSIS — E785 Hyperlipidemia, unspecified: Secondary | ICD-10-CM

## 2014-02-17 DIAGNOSIS — G43909 Migraine, unspecified, not intractable, without status migrainosus: Secondary | ICD-10-CM

## 2014-02-17 DIAGNOSIS — I10 Essential (primary) hypertension: Secondary | ICD-10-CM

## 2014-02-17 LAB — COMPREHENSIVE METABOLIC PANEL
ALT: 29 U/L (ref 0–53)
AST: 32 U/L (ref 0–37)
Albumin: 4.6 g/dL (ref 3.5–5.2)
Alkaline Phosphatase: 55 U/L (ref 39–117)
BUN: 13 mg/dL (ref 6–23)
CALCIUM: 10.1 mg/dL (ref 8.4–10.5)
CHLORIDE: 102 meq/L (ref 96–112)
CO2: 27 mEq/L (ref 19–32)
CREATININE: 1.1 mg/dL (ref 0.4–1.5)
GFR: 99.93 mL/min (ref >60.00)
GLUCOSE: 81 mg/dL (ref 70–99)
Potassium: 3.3 mEq/L — ABNORMAL LOW (ref 3.5–5.1)
Sodium: 136 mEq/L (ref 135–145)
Total Bilirubin: 1.1 mg/dL (ref 0.2–1.2)
Total Protein: 7.6 g/dL (ref 6.0–8.3)

## 2014-02-17 LAB — LIPID PANEL
CHOLESTEROL: 161 mg/dL (ref 0–200)
HDL: 46.4 mg/dL (ref >39.00)
LDL Cholesterol: 106 mg/dL — ABNORMAL HIGH (ref 0–99)
NonHDL: 114.6
TRIGLYCERIDES: 42 mg/dL (ref 0.0–149.0)
Total CHOL/HDL Ratio: 3
VLDL: 8.4 mg/dL (ref 0.0–40.0)

## 2014-02-17 NOTE — Assessment & Plan Note (Signed)
Stable on current dose of topamax with as needed relpax. No changes today.

## 2014-02-17 NOTE — Progress Notes (Signed)
Subjective:    Patient ID: Micheal Keller, male    DOB: 03/12/1971, 43 y.o.   MRN: [id number]  HPI  Very pleasant 43 yo male here for med refills.  HTN- Taking lisinopril 10 mg to HCTZ 12.5 mg daily. Strong FH of HTN.   BP Readings from Last 3 Encounters:  02/17/14 114/70  09/15/13 124/80  07/13/13 126/78   Migraine HA- topamax 50 mg qhs  (incrased from 25 mg in 07/2013) for prophylaxis and Relplax for abortive therapy. Has only had 1 migraine since I last saw him.  HLD- On vytorin.  Denies myalgias. Lab Results  Component Value Date   CHOL 162 07/13/2013   HDL 49.90 07/13/2013   LDLCALC 103* 07/13/2013   TRIG 46.0 07/13/2013   CHOLHDL 3 07/13/2013   ?anxiety/panic attacks- work has been more stressful.  At times, he cannot catch his breath and gets "worked up inside." Takes deep breaths and it usually gets better.  Denies feeling depressed.  Marriage is going well.  Appetite good.  Sleeping well.  No SI or HI.  This has been ongoing for at least 3 months or more.  Patient Active Problem List   Diagnosis Date Noted  . Penile pain 09/15/2013  . Allergic rhinitis 07/13/2013  . Internal hemorrhoids with anal itching 01/11/2013  . Anal itching 11/17/2012  . Migraine, unspecified, without mention of intractable migraine without mention of status migrainosus 09/09/2012  . Family history of cardiac arrhythmia 09/09/2012  . Hypertension   . Hyperlipidemia    Past Medical History  Diagnosis Date  . Hypertension   . Hyperlipidemia   . Migraines   . Hemorrhoids    Past Surgical History  Procedure Laterality Date  . Hemorrhoid banding     History  Substance Use Topics  . Smoking status: Former Research scientist (life sciences)  . Smokeless tobacco: Never Used  . Alcohol Use: Yes     Comment: occasionally   Family History  Problem Relation Age of Onset  . Diabetes Mother   . Hypertension Mother   . Hyperlipidemia Father   . [name] Parkinson White syndrome Brother   . Bone cancer Sister    No Known  Allergies Current Outpatient Prescriptions on File Prior to Visit  Medication Sig Dispense Refill  . eletriptan (RELPAX) 40 MG tablet Take 1 tablet (40 mg total) by mouth as needed for migraine. may repeat in 2 hours if necessary  10 tablet  1  . ezetimibe-simvastatin (VYTORIN) 10-10 MG per tablet TAKE 1 TABLET ONCE A DAY ORALLY  7 tablet  0  . lisinopril-hydrochlorothiazide (PRINZIDE,ZESTORETIC) 10-12.5 MG per tablet Take 1 tablet by mouth daily.  90 tablet  1  . Menthol-Zinc Oxide (CALMOSEPTINE) 0.44-20.625 % OINT Apply 1 application topically 2 (two) times daily.      . promethazine (PHENERGAN) 25 MG tablet Take 25 mg by mouth every 6 (six) hours as needed for nausea.      Marland Kitchen topiramate (TOPAMAX) 50 MG tablet Take 1 tablet (50 mg total) by mouth at bedtime.  90 tablet  6   No current facility-administered medications on file prior to visit.   The PMH, PSH, Social History, Family History, Medications, and allergies have been reviewed in [facility], and have been updated if relevant.   Review of Systems See HPI Patient reports no  vision/ hearing changes,anorexia, weight change, fever ,adenopathy, persistant / recurrent hoarseness, swallowing issues, chest pain, edema,persistant / recurrent cough, hemoptysis, dyspnea(rest, exertional, paroxysmal nocturnal)    Objective:   Physical Exam  BP 114/70  Pulse 71  Temp(Src) 98.4 F (36.9 C) (Oral)  Wt 197 lb (89.359 kg)  SpO2 97% General:  Pleasant male in NAD Mouth:  Oral mucosa and oropharynx without lesions or exudates.  Teeth in good repair. Neck:  no carotid bruit or thyromegaly no cervical or supraclavicular lymphadenopathy  Lungs:  Normal respiratory effort, chest expands symmetrically. Lungs are clear to auscultation, no crackles or wheezes. Heart:  Normal rate and regular rhythm. S1 and S2 normal without gallop, murmur, click, rub or other extra sounds. Pulses:  R and L posterior tibial pulses are full and equal bilaterally  Extremities:   no edema  Psych:  Good eye contact.  Not anxious or depressed appearing     Assessment & Plan:

## 2014-02-17 NOTE — Patient Instructions (Signed)
Great to see you. Have a good trip. We will call you with your lab results.  Think about starting Zoloft daily for anxiety, low dose xanax occasionally as needed for panic attacks.

## 2014-02-17 NOTE — Assessment & Plan Note (Signed)
New- likely due to work stressors. Discussed tx options. He is deferring psychotherapy.  Wants to think about meds- I suggested daily SSRi (zoloft) with possible infrequent use of benzo prn panic attacks.

## 2014-02-17 NOTE — Assessment & Plan Note (Signed)
Stable- well controlled on current rx. No changes.

## 2014-02-17 NOTE — Progress Notes (Signed)
Pre visit review using our clinic review tool, if applicable. No additional management support is needed unless otherwise documented below in the visit note. 

## 2014-02-17 NOTE — Assessment & Plan Note (Signed)
Well controlled on current dose of Vytorin. Check labs today.

## 2014-03-07 ENCOUNTER — Other Ambulatory Visit: Payer: Self-pay | Admitting: *Deleted

## 2014-03-07 MED ORDER — LISINOPRIL-HYDROCHLOROTHIAZIDE 10-12.5 MG PO TABS: 1.0000 | ORAL_TABLET | Freq: Every day | ORAL | Status: DC

## 2014-05-08 ENCOUNTER — Other Ambulatory Visit: Payer: Self-pay | Admitting: *Deleted

## 2014-05-08 MED ORDER — EZETIMIBE-SIMVASTATIN 10-10 MG PO TABS: ORAL_TABLET | ORAL | Status: DC

## 2014-05-11 ENCOUNTER — Telehealth: Payer: Self-pay | Admitting: Family Medicine

## 2014-05-11 NOTE — Telephone Encounter (Signed)
Patient Information:  Caller Name: Samvel  Phone: [phone]  Patient: Micheal Keller, Micheal Keller  Gender: Male  DOB: May 12, 1971  Age: 43 Years  PCP: Arnette Norris [facility])  Office Follow Up:  Does the office need to follow up with this patient?: Yes  Instructions For The Office: Pt needs to be seen today for "jock itch" possible celluitis.   Please call pt if he can be worked in.   Symptoms  Reason For Call & Symptoms: Pt wants something called in for "jock itch:  Sx are itching and redness that started 04/27/14.  Area is size of a 50 cent piece.  He ahs not tried any OTC products.  Reviewed Health History In EMR: Yes  Reviewed Medications In EMR: Yes  Reviewed Allergies In EMR: Yes  Reviewed Surgeries / Procedures: Yes  Date of Onset of Symptoms: 04/27/2014  Guideline(s) Used:  Jock Itch  Disposition Per Guideline:   See Today in Office  Reason For Disposition Reached:   Rash is painful to touch  Advice Given:  N/A  Keep area as clean and dry as possible.   Patient Will Follow Care Advice:  YES

## 2014-05-11 NOTE — Telephone Encounter (Signed)
unfortunately i cannot see as I have a meeting I need to get to tonight. rec [facility].

## 2014-05-11 NOTE — Telephone Encounter (Signed)
Noted  

## 2014-05-11 NOTE — Telephone Encounter (Signed)
Micheal Keller spoke with pt and pt scheduled appt with Dr Diona Browner on 05/12/14.

## 2014-05-12 ENCOUNTER — Encounter: Payer: Self-pay | Admitting: Family Medicine

## 2014-05-12 ENCOUNTER — Ambulatory Visit (INDEPENDENT_AMBULATORY_CARE_PROVIDER_SITE_OTHER): Payer: 59 | Admitting: Family Medicine

## 2014-05-12 VITALS — BP 114/80 | HR 63 | Temp 98.5°F | Ht 70.0 in | Wt 193.2 lb

## 2014-05-12 DIAGNOSIS — B356 Tinea cruris: Secondary | ICD-10-CM

## 2014-05-12 DIAGNOSIS — Z23 Encounter for immunization: Secondary | ICD-10-CM

## 2014-05-12 MED ORDER — KETOCONAZOLE 2 % EX CREA: 1.0000 "application " | TOPICAL_CREAM | Freq: Two times a day (BID) | CUTANEOUS | Status: DC

## 2014-05-12 NOTE — Assessment & Plan Note (Signed)
Preventative measures discussed.  Treat with antifungal cream.

## 2014-05-12 NOTE — Progress Notes (Signed)
   Subjective:    Patient ID: Micheal Keller, male    DOB: 11/24/1970, 43 y.o.   MRN: [id number]  HPI  43 year old male pt of Dr. Hulen Shouts  presents with new onset rash in right groin. He has been having itching in groin  Crease x several weeks.  There are areas of broken skin, dry skin in last few  days.  He has tried treating with peroxide, body lotion and alcohol.. Burned area. More itchy after exercise, sweating.  No fever.  No abdominal pain.  No past infections.  He does not know about any new exposures but is always using a different body wash.  N o history of DM.      Review of Systems  Constitutional: Negative for fever and fatigue.  HENT: Negative for ear pain.   Eyes: Negative for pain.  Respiratory: Negative for shortness of breath.   Cardiovascular: Negative for chest pain.  Gastrointestinal: Negative for abdominal pain.       Objective:   Physical Exam  Constitutional: Vital signs are normal. He appears well-developed and well-nourished.  HENT:  Head: Normocephalic.  Right Ear: Hearing normal.  Left Ear: Hearing normal.  Nose: Nose normal.  Mouth/Throat: Oropharynx is clear and moist and mucous membranes are normal.  Neck: Trachea normal. Carotid bruit is not present. No thyroid mass and no thyromegaly present.  Cardiovascular: Normal rate, regular rhythm and normal pulses.  Exam reveals no gallop, no distant heart sounds and no friction rub.   No murmur heard. No peripheral edema  Pulmonary/Chest: Effort normal and breath sounds normal. No respiratory distress.  Abdominal: There is no tenderness.  Skin: Skin is warm, dry and intact. No rash noted.  Right groin crease red macerated tissue.  Psychiatric: He has a normal mood and affect. His speech is normal and behavior is normal. Thought content normal.          Assessment & Plan:

## 2014-05-12 NOTE — Progress Notes (Signed)
Pre visit review using our clinic review tool, if applicable. No additional management support is needed unless otherwise documented below in the visit note. 

## 2014-05-12 NOTE — Patient Instructions (Signed)
Recommend avoidance of tight-fitting clothing and non-cotton underwear.   Tinea  Cruris or Jock Itch Tinea Cruris is a fungal infection of the skin in the groin area. It is sometimes called "ringworm" even though it is not caused by a worm. A fungus is a type of germ that thrives in dark, damp places.  CAUSES  This infection may spread from:  A fungus infection elsewhere on the body (such as athlete's foot).  Sharing towels or clothing. This infection is more common in:  Hot, humid climates.  People who wear tight-fitting clothing or wet bathing suits for long periods of time.  Athletes.  Overweight people.  People with diabetes. SYMPTOMS  Jock itch causes the following symptoms:  Red, pink or brown rash in the groin. Rash may spread to the thighs, anus, and buttocks.  Itching. DIAGNOSIS  Your caregiver may make the diagnosis by looking at the rash. Sometimes a skin scraping will be sent to test for fungus. Testing can be done either by looking under the microscope or by doing a culture (test to try to grow the fungus). A culture can take up to 2 weeks to come back. TREATMENT  Jock itch may be treated with:  Skin cream or ointment to kill fungus.  Medicine by mouth to kill fungus.  Skin cream or ointment to calm the itching.  Compresses or medicated powders to dry the infected skin. HOME CARE INSTRUCTIONS   Be sure to treat the rash completely. Follow your caregiver's instructions. It can take a couple of weeks to treat. If you do not treat the infection long enough, the rash can come back.  Wear loose-fitting clothing.  Men should wear cotton boxer shorts.  Women should wear cotton underwear.  Avoid hot baths.  Dry the groin area well after bathing. SEEK MEDICAL CARE IF:   Your rash is worse.  Your rash is spreading.  Your rash returns after treatment is finished.  Your rash is not gone in 4 weeks. Fungal infections are slow to respond to treatment. Some  redness may remain for several weeks after the fungus is gone. SEEK IMMEDIATE MEDICAL CARE IF:  The area becomes red, warm, tender, and swollen.  You have a fever. Document Released: 06/13/2002 Document Revised: 09/15/2011 Document Reviewed: 05/12/2008 [facility] Patient Information 2015 [place], [place]. This information is not intended to replace advice given to you by your health care provider. Make sure you discuss any questions you have with your health care provider.

## 2014-08-07 ENCOUNTER — Other Ambulatory Visit: Payer: Self-pay | Admitting: *Deleted

## 2014-08-07 MED ORDER — EZETIMIBE-SIMVASTATIN 10-10 MG PO TABS: ORAL_TABLET | ORAL | Status: DC

## 2014-08-28 ENCOUNTER — Other Ambulatory Visit: Payer: Self-pay

## 2014-08-28 MED ORDER — TOPIRAMATE 50 MG PO TABS: 50.0000 mg | ORAL_TABLET | Freq: Every day | ORAL | Status: DC

## 2014-08-28 NOTE — Telephone Encounter (Signed)
Pt left v/m requesting refill topamax to [facility];pt last seen 02/17/14 for h/a. No future appt scheduled.Please advise.

## 2014-09-08 ENCOUNTER — Other Ambulatory Visit: Payer: Self-pay | Admitting: Family Medicine

## 2014-11-09 ENCOUNTER — Other Ambulatory Visit: Payer: Self-pay

## 2014-11-09 MED ORDER — EZETIMIBE-SIMVASTATIN 10-10 MG PO TABS: ORAL_TABLET | ORAL | Status: DC

## 2014-11-09 NOTE — Telephone Encounter (Signed)
Pt request refill vytorin to [facility]; pt last labs 02/2014 and pt will cb to schedule appt to see Dr Deborra Medina and labs.refill done # 90.

## 2014-12-15 ENCOUNTER — Other Ambulatory Visit: Payer: Self-pay

## 2014-12-15 MED ORDER — LISINOPRIL-HYDROCHLOROTHIAZIDE 10-12.5 MG PO TABS: 1.0000 | ORAL_TABLET | Freq: Every day | ORAL | Status: DC

## 2014-12-15 NOTE — Telephone Encounter (Signed)
Pt request refill lisinopril-HCTZ; pt scheduled appt on 12/20/14 at 8 AM with Dr Deborra Medina for med refill appt. Lisinopril HCTZ # 30 x 0 sent to [facility]. Pt voiced understanding.

## 2014-12-20 ENCOUNTER — Encounter: Payer: Self-pay | Admitting: Family Medicine

## 2014-12-20 ENCOUNTER — Encounter: Payer: Self-pay | Admitting: *Deleted

## 2014-12-20 ENCOUNTER — Ambulatory Visit (INDEPENDENT_AMBULATORY_CARE_PROVIDER_SITE_OTHER): Payer: 59 | Admitting: Family Medicine

## 2014-12-20 VITALS — BP 118/62 | HR 69 | Temp 98.4°F | Wt 201.0 lb

## 2014-12-20 DIAGNOSIS — G43809 Other migraine, not intractable, without status migrainosus: Secondary | ICD-10-CM

## 2014-12-20 DIAGNOSIS — I10 Essential (primary) hypertension: Secondary | ICD-10-CM

## 2014-12-20 DIAGNOSIS — E785 Hyperlipidemia, unspecified: Secondary | ICD-10-CM

## 2014-12-20 LAB — COMPREHENSIVE METABOLIC PANEL
ALT: 25 U/L (ref 0–53)
AST: 26 U/L (ref 0–37)
Albumin: 4.6 g/dL (ref 3.5–5.2)
Alkaline Phosphatase: 52 U/L (ref 39–117)
BUN: 13 mg/dL (ref 6–23)
CO2: 28 meq/L (ref 19–32)
Calcium: 9.7 mg/dL (ref 8.4–10.5)
Chloride: 103 mEq/L (ref 96–112)
Creatinine, Ser: 1.05 mg/dL (ref 0.40–1.50)
GFR: 99.52 mL/min (ref >60.00)
Glucose, Bld: 98 mg/dL (ref 70–99)
POTASSIUM: 3.6 meq/L (ref 3.5–5.1)
SODIUM: 137 meq/L (ref 135–145)
TOTAL PROTEIN: 7.3 g/dL (ref 6.0–8.3)
Total Bilirubin: 0.6 mg/dL (ref 0.2–1.2)

## 2014-12-20 LAB — LIPID PANEL
Cholesterol: 165 mg/dL (ref 0–200)
HDL: 48.1 mg/dL (ref >39.00)
LDL Cholesterol: 98 mg/dL (ref 0–99)
NonHDL: 116.9
Total CHOL/HDL Ratio: 3
Triglycerides: 95 mg/dL (ref 0.0–149.0)
VLDL: 19 mg/dL (ref 0.0–40.0)

## 2014-12-20 MED ORDER — LISINOPRIL 10 MG PO TABS: 10.0000 mg | ORAL_TABLET | Freq: Every day | ORAL | Status: DC

## 2014-12-20 MED ORDER — EZETIMIBE-SIMVASTATIN 10-10 MG PO TABS: ORAL_TABLET | ORAL | Status: DC

## 2014-12-20 NOTE — Assessment & Plan Note (Addendum)
Continue current rx. Labs today.  Orders Placed This Encounter  Procedures  . Lipid panel  . Comprehensive metabolic panel

## 2014-12-20 NOTE — Assessment & Plan Note (Signed)
Well controlled on current dose of topamax for prophylaxis.  Rarely requiring abortive therapy. No changes made today. The patient indicates understanding of these issues and agrees with the plan.

## 2014-12-20 NOTE — Progress Notes (Signed)
Subjective:    Patient ID: Micheal Keller, male    DOB: 08-Jan-1971, 44 y.o.   MRN: [id number]  HPI  Very pleasant 44 yo male here for med refills.  HTN- Taking lisinopril 10 mg to HCTZ 12.5 mg daily. Strong FH of HTN.   Has noticed that he is more dizzy from a standing position lately.  No syncope.  No HA.  No nausea or vomiting.  No CP or SOB.  BP Readings from Last 3 Encounters:  12/20/14 118/62  05/12/14 114/80  02/17/14 114/70   Migraine HA- topamax 50 mg qhs  (incrased from 25 mg in 07/2013) for prophylaxis and Relplax for abortive therapy. Cannot remember the last time he had a migraine.  HLD- On vytorin.  Denies myalgias. Lab Results  Component Value Date   CHOL 161 02/17/2014   HDL 46.40 02/17/2014   LDLCALC 106* 02/17/2014   TRIG 42.0 02/17/2014   CHOLHDL 3 02/17/2014     Patient Active Problem List   Diagnosis Date Noted  . Tinea cruris 05/12/2014  . Adjustment disorder with mixed anxiety and depressed mood 02/17/2014  . Penile pain 09/15/2013  . Allergic rhinitis 07/13/2013  . Internal hemorrhoids with anal itching 01/11/2013  . Anal itching 11/17/2012  . Migraine, unspecified, without mention of intractable migraine without mention of status migrainosus 09/09/2012  . Family history of cardiac arrhythmia 09/09/2012  . Hypertension   . Hyperlipidemia    Past Medical History  Diagnosis Date  . Hypertension   . Hyperlipidemia   . Migraines   . Hemorrhoids    Past Surgical History  Procedure Laterality Date  . Hemorrhoid banding     History  Substance Use Topics  . Smoking status: Former Research scientist (life sciences)  . Smokeless tobacco: Never Used  . Alcohol Use: Yes     Comment: occasionally   Family History  Problem Relation Age of Onset  . Diabetes Mother   . Hypertension Mother   . Hyperlipidemia Father   . [name] Parkinson White syndrome Brother   . Bone cancer Sister    No Known Allergies Current Outpatient Prescriptions on File Prior to Visit  Medication Sig  Dispense Refill  . eletriptan (RELPAX) 40 MG tablet Take 1 tablet (40 mg total) by mouth as needed for migraine. may repeat in 2 hours if necessary 10 tablet 1  . ezetimibe-simvastatin (VYTORIN) 10-10 MG per tablet TAKE 1 TABLET ONCE A DAY ORALLY 90 tablet 0  . ketoconazole (NIZORAL) 2 % cream Apply 1 application topically 2 (two) times daily. 15 g 0  . lisinopril-hydrochlorothiazide (PRINZIDE,ZESTORETIC) 10-12.5 MG per tablet Take 1 tablet by mouth daily. 30 tablet 0  . Menthol-Zinc Oxide (CALMOSEPTINE) 0.44-20.625 % OINT Apply 1 application topically 2 (two) times daily.    . promethazine (PHENERGAN) 25 MG tablet Take 25 mg by mouth every 6 (six) hours as needed for nausea.    Marland Kitchen topiramate (TOPAMAX) 50 MG tablet Take 1 tablet (50 mg total) by mouth at bedtime. 90 tablet 3   No current facility-administered medications on file prior to visit.   The PMH, PSH, Social History, Family History, Medications, and allergies have been reviewed in [facility], and have been updated if relevant.   Review of Systems Review of Systems  Constitutional: Negative.   HENT: Negative.   Respiratory: Negative.   Cardiovascular: Negative.   Gastrointestinal: Negative.   Musculoskeletal: Negative.   Skin: Negative.   Neurological: Positive for dizziness and light-headedness. Negative for tremors, seizures, syncope, facial asymmetry, speech difficulty,  weakness, numbness and headaches.  Hematological: Negative.   Psychiatric/Behavioral: Negative.   All other systems reviewed and are negative.      Objective:   Physical Exam BP 118/62 mmHg  Pulse 69  Temp(Src) 98.4 F (36.9 C) (Oral)  Wt 201 lb (91.173 kg)  SpO2 98%  Wt Readings from Last 3 Encounters:  12/20/14 201 lb (91.173 kg)  05/12/14 193 lb 4 oz (87.658 kg)  02/17/14 197 lb (89.359 kg)    Physical Exam  Constitutional: He is oriented to person, place, and time and well-developed, well-nourished, and in no distress. No distress.  HENT:  Head:  Normocephalic and atraumatic.  Eyes: Conjunctivae are normal.  Cardiovascular: Normal rate and regular rhythm.   Pulmonary/Chest: Effort normal and breath sounds normal. No respiratory distress. He has no wheezes. He has no rales.  Musculoskeletal: Normal range of motion. He exhibits no edema.  Neurological: He is alert and oriented to person, place, and time.  Skin: Skin is warm and dry. He is not diaphoretic.  Psychiatric: Mood, memory, affect and judgment normal.  Nursing note and vitals reviewed.      Assessment & Plan:

## 2014-12-20 NOTE — Progress Notes (Signed)
Pre visit review using our clinic review tool, if applicable. No additional management support is needed unless otherwise documented below in the visit note. 

## 2014-12-20 NOTE — Assessment & Plan Note (Signed)
Now with symptoms of orthostatic hypotension. D/c lisinopril/HCTZ combo pill. eRx sent for lisinopril 10 mg daily. He has BP cuff at home- he will keep me updated with readings if BP consistently above 145/90 or he becomes symptomatic- ie headaches, flushing, etc. The patient indicates understanding of these issues and agrees with the plan.

## 2014-12-20 NOTE — Patient Instructions (Signed)
Great to see you. Please stop taking your lisinopril-HCTZ. Start taking lisinopril 10 mg daily.  Check your blood pressure and call me if the top number goes about 145 consistently.  We will call you with your lab results.

## 2015-05-28 ENCOUNTER — Encounter: Payer: Self-pay | Admitting: Family Medicine

## 2015-08-10 ENCOUNTER — Encounter: Payer: Self-pay | Admitting: Family Medicine

## 2015-08-10 ENCOUNTER — Ambulatory Visit (INDEPENDENT_AMBULATORY_CARE_PROVIDER_SITE_OTHER): Payer: 59 | Admitting: Family Medicine

## 2015-08-10 VITALS — BP 122/86 | HR 92 | Temp 98.9°F | Wt 211.5 lb

## 2015-08-10 DIAGNOSIS — J309 Allergic rhinitis, unspecified: Secondary | ICD-10-CM | POA: Diagnosis not present

## 2015-08-10 MED ORDER — FEXOFENADINE HCL 180 MG PO TABS: 180.0000 mg | ORAL_TABLET | Freq: Every day | ORAL | Status: DC

## 2015-08-10 MED ORDER — FLUTICASONE PROPIONATE 50 MCG/ACT NA SUSP: 2.0000 | Freq: Every day | NASAL | Status: DC

## 2015-08-10 NOTE — Patient Instructions (Signed)
Stop afrin, change to flonase, and update [place] as needed.  Take care. Glad to see you.

## 2015-08-10 NOTE — Progress Notes (Signed)
Pre visit review using our clinic review tool, if applicable. No additional management support is needed unless otherwise documented below in the visit note.  R max sinus pressure and sx for about 4 weeks.  Taking pseudophed and afrin to help with air movement on the R nostril.  transient relief.   Pain/pressure has triggered migraines.   Fatigued.  ST.  Nasal drip noted.  No fevers.  No ear pain.  He can get his ears to pop.  He'll have hot and cold spells.   Some cough- dry, no vomiting.    Meds, vitals, and allergies reviewed.   ROS: See HPI.  Otherwise, noncontributory.  GEN: nad, alert and oriented HEENT: mucous membranes moist, tm w/o erythema, nasal exam w/o erythema, clear discharge noted,  OP with cobblestoning, R max sinus slightly ttp NECK: supple w/o LA CV: rrr.   PULM: ctab, no inc wob EXT: no edema SKIN: no acute rash

## 2015-08-12 NOTE — Assessment & Plan Note (Signed)
Likely with rebound sx, rec stop afrin, change to flonase, and update [place] as needed.  He agrees.  Nontoxic. Okay for outpatient f/u.

## 2015-08-13 ENCOUNTER — Other Ambulatory Visit: Payer: Self-pay | Admitting: Family Medicine

## 2015-08-14 ENCOUNTER — Telehealth: Payer: Self-pay

## 2015-08-14 NOTE — Telephone Encounter (Signed)
Pt left v/m;pt seen 08/10/15; sinus pressure no better, has low grade fever and sinus pressure and pain has worsened; pt request abx [facility]. Pt request cb.

## 2015-08-15 MED ORDER — AMOXICILLIN-POT CLAVULANATE 875-125 MG PO TABS: 1.0000 | ORAL_TABLET | Freq: Two times a day (BID) | ORAL | Status: DC

## 2015-08-15 NOTE — Telephone Encounter (Signed)
Patient notified as instructed by telephone and verbalized understanding. 

## 2015-08-15 NOTE — Telephone Encounter (Signed)
abx sent.  Thanks.

## 2015-09-18 ENCOUNTER — Ambulatory Visit (INDEPENDENT_AMBULATORY_CARE_PROVIDER_SITE_OTHER): Payer: 59 | Admitting: Family Medicine

## 2015-09-18 ENCOUNTER — Encounter: Payer: Self-pay | Admitting: Family Medicine

## 2015-09-18 VITALS — BP 132/88 | HR 82 | Temp 99.2°F | Wt 207.2 lb

## 2015-09-18 DIAGNOSIS — B9789 Other viral agents as the cause of diseases classified elsewhere: Principal | ICD-10-CM

## 2015-09-18 DIAGNOSIS — J069 Acute upper respiratory infection, unspecified: Secondary | ICD-10-CM | POA: Diagnosis not present

## 2015-09-18 MED ORDER — BENZONATATE 200 MG PO CAPS: 200.0000 mg | ORAL_CAPSULE | Freq: Three times a day (TID) | ORAL | Status: DC | PRN

## 2015-09-18 MED ORDER — GUAIFENESIN-CODEINE 100-10 MG/5ML PO SYRP: 5.0000 mL | ORAL_SOLUTION | Freq: Four times a day (QID) | ORAL | Status: DC | PRN

## 2015-09-18 NOTE — Progress Notes (Signed)
Subjective:    Patient ID: Micheal Keller, male    DOB: [birth year], 45 y.o.   MRN: [id number]  HPI Here for uri symptoms   Was on abx for sinus infection last month - and it really helped those symptoms  Just cannot kick the cough   Now some nasal congestion  No facial pain   Phlegm is yellow for the most part- from nose and chest   Chest is a little tight  achey all over  For 3 days - temp 100-101   Had a flu shot back in the fall   Taking tylenol for fever  Took some benadryl one time   Does take zyrtec 10 mg -this week  Also using flonase nasal spray  No longer uses afrin   Patient Active Problem List   Diagnosis Date Noted  . Tinea cruris 05/12/2014  . Adjustment disorder with mixed anxiety and depressed mood 02/17/2014  . Penile pain 09/15/2013  . Allergic rhinitis 07/13/2013  . Internal hemorrhoids with anal itching 01/11/2013  . Anal itching 11/17/2012  . Migraine 09/09/2012  . Family history of cardiac arrhythmia 09/09/2012  . Hypertension   . Hyperlipidemia    Past Medical History  Diagnosis Date  . Hypertension   . Hyperlipidemia   . Migraines   . Hemorrhoids    Past Surgical History  Procedure Laterality Date  . Hemorrhoid banding     Social History  Substance Use Topics  . Smoking status: Former Research scientist (life sciences)  . Smokeless tobacco: Never Used  . Alcohol Use: 0.0 oz/week    0 Standard drinks or equivalent per week     Comment: occasionally   Family History  Problem Relation Age of Onset  . Diabetes Mother   . Hypertension Mother   . Hyperlipidemia Father   . [name] Parkinson White syndrome Brother   . Bone cancer Sister    No Known Allergies Current Outpatient Prescriptions on File Prior to Visit  Medication Sig Dispense Refill  . eletriptan (RELPAX) 40 MG tablet Take 1 tablet (40 mg total) by mouth as needed for migraine. may repeat in 2 hours if necessary 10 tablet 1  . ezetimibe-simvastatin (VYTORIN) 10-10 MG per tablet TAKE 1 TABLET ONCE A DAY  ORALLY 90 tablet 3  . fluticasone (FLONASE) 50 MCG/ACT nasal spray Place 2 sprays into both nostrils daily. 16 g 1  . ketoconazole (NIZORAL) 2 % cream Apply 1 application topically 2 (two) times daily. 15 g 0  . lisinopril (PRINIVIL,ZESTRIL) 10 MG tablet Take 1 tablet (10 mg total) by mouth daily. 90 tablet 3  . Menthol-Zinc Oxide (CALMOSEPTINE) 0.44-20.625 % OINT Apply 1 application topically 2 (two) times daily.    . promethazine (PHENERGAN) 25 MG tablet Take 25 mg by mouth every 6 (six) hours as needed for nausea.    Marland Kitchen topiramate (TOPAMAX) 50 MG tablet Take 1 tablet (50 mg total) by mouth at bedtime. OFFICE VISIT REQUIRED FOR ADDITIONAL REFILLS 90 tablet 0   No current facility-administered medications on file prior to visit.      Review of Systems  Constitutional: Positive for fever, appetite change and fatigue.  HENT: Positive for congestion, postnasal drip, rhinorrhea, sinus pressure, sneezing and sore throat. Negative for ear pain.   Eyes: Negative for pain and discharge.  Respiratory: Positive for cough. Negative for shortness of breath, wheezing and stridor.   Cardiovascular: Negative for chest pain.  Gastrointestinal: Negative for nausea, vomiting and diarrhea.  Genitourinary: Negative for urgency, frequency and hematuria.  Musculoskeletal: Negative for myalgias and arthralgias.  Skin: Negative for rash.  Neurological: Positive for headaches. Negative for dizziness, weakness and light-headedness.  Psychiatric/Behavioral: Negative for confusion and dysphoric mood.       Objective:   Physical Exam  Constitutional: He appears well-developed and well-nourished. No distress.  obese and well appearing   HENT:  Head: Normocephalic and atraumatic.  Right Ear: External ear normal.  Left Ear: External ear normal.  Mouth/Throat: Oropharynx is clear and moist.  Nares are injected and congested  No sinus tenderness Clear rhinorrhea and post nasal drip   Eyes: Conjunctivae and EOM  are normal. Pupils are equal, round, and reactive to light. Right eye exhibits no discharge. Left eye exhibits no discharge.  Neck: Normal range of motion. Neck supple.  Cardiovascular: Normal rate and normal heart sounds.   Pulmonary/Chest: Effort normal and breath sounds normal. No respiratory distress. He has no wheezes. He has no rales. He exhibits no tenderness.  Harsh bs No rales or rhonchi Good air exch  Lymphadenopathy:    He has no cervical adenopathy.  Neurological: He is alert.  Skin: Skin is warm and dry. No rash noted.  Psychiatric: He has a normal mood and affect.          Assessment & Plan:   Problem List Items Addressed This Visit      Respiratory   Viral URI with cough - Primary    New viral uri on top of cyclic cough from past uri I think you have a viral upper respiratory infection with a cyclic cough on top of that from last illness Rest and fluids  mucinex DM for cough -when working or driving  Also tessalon pills   When not working or driving - use the robitussin [facility] - with caution of sedation (instead of mucinex)   Continue tylenol for fever  Continue zyrtec and flonase   Update if not starting to improve in a week or if worsening

## 2015-09-18 NOTE — Progress Notes (Signed)
Pre visit review using our clinic review tool, if applicable. No additional management support is needed unless otherwise documented below in the visit note. 

## 2015-09-18 NOTE — Assessment & Plan Note (Signed)
New viral uri on top of cyclic cough from past uri I think you have a viral upper respiratory infection with a cyclic cough on top of that from last illness Rest and fluids  mucinex DM for cough -when working or driving  Also tessalon pills   When not working or driving - use the robitussin [facility] - with caution of sedation (instead of mucinex)   Continue tylenol for fever  Continue zyrtec and flonase   Update if not starting to improve in a week or if worsening

## 2015-09-18 NOTE — Patient Instructions (Signed)
I think you have a viral upper respiratory infection with a cyclic cough on top of that from last illness Rest and fluids  mucinex DM for cough -when working or driving  Also tessalon pills   When not working or driving - use the robitussin [facility] - with caution of sedation (instead of mucinex)   Continue tylenol for fever  Continue zyrtec and flonase   Update if not starting to improve in a week or if worsening

## 2015-09-26 ENCOUNTER — Encounter: Payer: Self-pay | Admitting: Family Medicine

## 2015-09-26 ENCOUNTER — Ambulatory Visit (INDEPENDENT_AMBULATORY_CARE_PROVIDER_SITE_OTHER): Payer: 59 | Admitting: Family Medicine

## 2015-09-26 VITALS — BP 120/84 | HR 70 | Temp 98.0°F | Wt 209.2 lb

## 2015-09-26 DIAGNOSIS — G43809 Other migraine, not intractable, without status migrainosus: Secondary | ICD-10-CM

## 2015-09-26 DIAGNOSIS — I10 Essential (primary) hypertension: Secondary | ICD-10-CM | POA: Diagnosis not present

## 2015-09-26 DIAGNOSIS — E785 Hyperlipidemia, unspecified: Secondary | ICD-10-CM | POA: Diagnosis not present

## 2015-09-26 DIAGNOSIS — M79604 Pain in right leg: Secondary | ICD-10-CM | POA: Diagnosis not present

## 2015-09-26 LAB — COMPREHENSIVE METABOLIC PANEL
ALBUMIN: 4.7 g/dL (ref 3.5–5.2)
ALK PHOS: 59 U/L (ref 39–117)
ALT: 45 U/L (ref 0–53)
AST: 31 U/L (ref 0–37)
BUN: 9 mg/dL (ref 6–23)
CALCIUM: 9.6 mg/dL (ref 8.4–10.5)
CO2: 25 mEq/L (ref 19–32)
CREATININE: 1.01 mg/dL (ref 0.40–1.50)
Chloride: 106 mEq/L (ref 96–112)
GFR: 103.7 mL/min (ref >60.00)
Glucose, Bld: 99 mg/dL (ref 70–99)
Potassium: 3.6 mEq/L (ref 3.5–5.1)
SODIUM: 140 meq/L (ref 135–145)
TOTAL PROTEIN: 7.7 g/dL (ref 6.0–8.3)
Total Bilirubin: 0.5 mg/dL (ref 0.2–1.2)

## 2015-09-26 LAB — LIPID PANEL
CHOLESTEROL: 171 mg/dL (ref 0–200)
HDL: 34 mg/dL — ABNORMAL LOW (ref >39.00)
LDL Cholesterol: 109 mg/dL — ABNORMAL HIGH (ref 0–99)
NonHDL: 136.95
TRIGLYCERIDES: 139 mg/dL (ref 0.0–149.0)
Total CHOL/HDL Ratio: 5
VLDL: 27.8 mg/dL (ref 0.0–40.0)

## 2015-09-26 MED ORDER — EZETIMIBE-SIMVASTATIN 10-10 MG PO TABS: ORAL_TABLET | ORAL | Status: DC

## 2015-09-26 MED ORDER — TOPIRAMATE 50 MG PO TABS: 50.0000 mg | ORAL_TABLET | Freq: Every day | ORAL | Status: DC

## 2015-09-26 MED ORDER — LISINOPRIL 10 MG PO TABS: 10.0000 mg | ORAL_TABLET | Freq: Every day | ORAL | Status: DC

## 2015-09-26 NOTE — Assessment & Plan Note (Signed)
Continue statin. Check labs today. 

## 2015-09-26 NOTE — Progress Notes (Signed)
Subjective:    Patient ID: Micheal Keller, male    DOB: August 31, 1970, 45 y.o.   MRN: [id number]  HPI  Very pleasant 45 yo male here for follow up with new complaint of leg pain.  Right leg pain- a few days ago, was sitting hunched over his desk and had acute onset of warmth and pain down his inner right thigh.  Leg has been sore since then.  Ibuprofen did help initially.  Has not had the numb feeling since.  HTN- Taking lisinopril 10 mg  daily. Strong FH of HTN.  No syncope.  No HA.  No nausea or vomiting.  No CP or SOB.  BP Readings from Last 3 Encounters:  09/26/15 120/84  09/18/15 132/88  08/10/15 122/86   Migraine HA- topamax 50 mg qhs  (increased from 25 mg in 07/2013) for prophylaxis and Relplax for abortive therapy. Cannot remember the last time he had a migraine.  HLD- On vytorin.  Denies myalgias. Lab Results  Component Value Date   CHOL 165 12/20/2014   HDL 48.10 12/20/2014   LDLCALC 98 12/20/2014   TRIG 95.0 12/20/2014   CHOLHDL 3 12/20/2014     Patient Active Problem List   Diagnosis Date Noted  . Right leg pain 09/26/2015  . Adjustment disorder with mixed anxiety and depressed mood 02/17/2014  . Allergic rhinitis 07/13/2013  . Migraine 09/09/2012  . Family history of cardiac arrhythmia 09/09/2012  . Hypertension   . Hyperlipidemia    Past Medical History  Diagnosis Date  . Hypertension   . Hyperlipidemia   . Migraines   . Hemorrhoids    Past Surgical History  Procedure Laterality Date  . Hemorrhoid banding     Social History  Substance Use Topics  . Smoking status: Former Research scientist (life sciences)  . Smokeless tobacco: Never Used  . Alcohol Use: 0.0 oz/week    0 Standard drinks or equivalent per week     Comment: occasionally   Family History  Problem Relation Age of Onset  . Diabetes Mother   . Hypertension Mother   . Hyperlipidemia Father   . [name] Parkinson White syndrome Brother   . Bone cancer Sister    No Known Allergies Current Outpatient Prescriptions on  File Prior to Visit  Medication Sig Dispense Refill  . cetirizine (ZYRTEC) 10 MG tablet Take 10 mg by mouth daily.    Marland Kitchen eletriptan (RELPAX) 40 MG tablet Take 1 tablet (40 mg total) by mouth as needed for migraine. may repeat in 2 hours if necessary 10 tablet 1  . fluticasone (FLONASE) 50 MCG/ACT nasal spray Place 2 sprays into both nostrils daily. 16 g 1  . ketoconazole (NIZORAL) 2 % cream Apply 1 application topically 2 (two) times daily. 15 g 0  . Menthol-Zinc Oxide (CALMOSEPTINE) 0.44-20.625 % OINT Apply 1 application topically 2 (two) times daily.    . promethazine (PHENERGAN) 25 MG tablet Take 25 mg by mouth every 6 (six) hours as needed for nausea.     No current facility-administered medications on file prior to visit.   The PMH, PSH, Social History, Family History, Medications, and allergies have been reviewed in [facility], and have been updated if relevant.   Review of Systems Review of Systems  Constitutional: Negative.   HENT: Negative.   Respiratory: Negative.   Cardiovascular: Negative.   Gastrointestinal: Negative.   Musculoskeletal: Positive for myalgias.  Skin: Negative.   Neurological: Positive for numbness. Negative for dizziness, tremors, seizures, syncope, facial asymmetry, speech difficulty, weakness, light-headedness  and headaches.  Hematological: Negative.   Psychiatric/Behavioral: Negative.   All other systems reviewed and are negative.      Objective:   Physical Exam BP 120/84 mmHg  Pulse 70  Temp(Src) 98 F (36.7 C) (Oral)  Wt 209 lb 4 oz (94.915 kg)  SpO2 96%  Wt Readings from Last 3 Encounters:  09/26/15 209 lb 4 oz (94.915 kg)  09/18/15 207 lb 4 oz (94.008 kg)  08/10/15 211 lb 8 oz (95.936 kg)    Physical Exam  Constitutional: He is oriented to person, place, and time and well-developed, well-nourished, and in no distress. No distress.  HENT:  Head: Normocephalic and atraumatic.  Eyes: Conjunctivae are normal.  Neck: Normal range of motion.   Cardiovascular: Normal rate and regular rhythm.   Pulmonary/Chest: Effort normal and breath sounds normal. No respiratory distress. He has no wheezes. He has no rales.  Musculoskeletal: Normal range of motion. He exhibits no edema.  Right inner thigh- normal sensation.  No palpable cords or lumps.  Non tender to palpation.  Neurological: He is alert and oriented to person, place, and time.  Skin: Skin is warm and dry. He is not diaphoretic.  Psychiatric: Mood, memory, affect and judgment normal.  Nursing note and vitals reviewed.      Assessment & Plan:

## 2015-09-26 NOTE — Assessment & Plan Note (Signed)
Well controlled on current rxs. No changes made today. 

## 2015-09-26 NOTE — Patient Instructions (Signed)
Good to see you. I will call you with your lab results.  Start taking Ibuprofen 800 mg three times daily with food for next few days.  Please update me.

## 2015-09-26 NOTE — Assessment & Plan Note (Signed)
Well controlled on current dose of lisinopril. No changes made to rx today.

## 2015-09-26 NOTE — Assessment & Plan Note (Signed)
New- resolving. ? Pinched nerve from sacrum. No red flag symptoms. Advised Ibuprofen 800 mg three times daily for next few days. Call or return to clinic prn if these symptoms worsen or fail to improve as anticipated. The patient indicates understanding of these issues and agrees with the plan.

## 2015-10-05 ENCOUNTER — Other Ambulatory Visit: Payer: Self-pay | Admitting: Family Medicine

## 2015-11-11 ENCOUNTER — Other Ambulatory Visit: Payer: Self-pay | Admitting: Family Medicine

## 2015-11-12 NOTE — Telephone Encounter (Signed)
rx filled 09/2015 #90 [id number]

## 2015-11-30 ENCOUNTER — Encounter: Payer: Self-pay | Admitting: Family Medicine

## 2015-12-04 ENCOUNTER — Other Ambulatory Visit: Payer: Self-pay | Admitting: Family Medicine

## 2015-12-07 ENCOUNTER — Encounter: Payer: Self-pay | Admitting: Primary Care

## 2015-12-07 ENCOUNTER — Ambulatory Visit (INDEPENDENT_AMBULATORY_CARE_PROVIDER_SITE_OTHER): Payer: 59 | Admitting: Primary Care

## 2015-12-07 VITALS — BP 130/78 | HR 80 | Temp 97.8°F | Wt 206.0 lb

## 2015-12-07 DIAGNOSIS — F4323 Adjustment disorder with mixed anxiety and depressed mood: Secondary | ICD-10-CM | POA: Diagnosis not present

## 2015-12-07 DIAGNOSIS — F411 Generalized anxiety disorder: Secondary | ICD-10-CM | POA: Diagnosis not present

## 2015-12-07 MED ORDER — SERTRALINE HCL 50 MG PO TABS: 50.0000 mg | ORAL_TABLET | Freq: Every day | ORAL | Status: DC

## 2015-12-07 MED ORDER — ALPRAZOLAM 0.25 MG PO TABS: 0.2500 mg | ORAL_TABLET | Freq: Two times a day (BID) | ORAL | Status: DC | PRN

## 2015-12-07 NOTE — Patient Instructions (Signed)
Start Zoloft 50 mg tablets for anxiety. Take 1/2 tablet by mouth daily for 10 days, then advance to 1 full tablet thereafter.   Use the alprazolam (Xanax) very sparingly as needed for panic attacks until the Zoloft can reach its full effect.  Schedule a follow up appointment with Dr. Deborra Medina in 4-6 weeks for re-evaluation.  It was a pleasure meeting you!

## 2015-12-07 NOTE — Progress Notes (Signed)
Pre visit review using our clinic review tool, if applicable. No additional management support is needed unless otherwise documented below in the visit note. 

## 2015-12-07 NOTE — Assessment & Plan Note (Signed)
Long history of since childhood, recently worse. GAD 7 score of 17 today. Long discussion today regarding different avenues of treatment. He declines therapy, accepted treatment with medication which I believe is appropriate in this case.  Start Zoloft 50 mg. Patient is to take 1/2 tablet daily for 10 days, then advance to 1 full tablet thereafter. We discussed possible side effects of headache, GI upset, drowsiness, and SI/HI. If thoughts of SI/HI develop, we discussed to present to the emergency immediately. Patient verbalized understanding.   Also provided him with temporary supply of low dose Alprazolam. Discussed this is temporary and is to be used sparingly.   He is to follow up with PCP in 4-6 weeks for re-evaluation.

## 2015-12-07 NOTE — Progress Notes (Signed)
Subjective:    Patient ID: Micheal Keller, male    DOB: January 03, 1971, 45 y.o.   MRN: [id number]  HPI  Micheal Keller is a 45 year old male who presents today with multiple complaints.  1) Adjustment disorder with Mixed Anxiety and Depressed Mood: Prior history of anxiety based off documentation in 2015. Based off of his chart he was offered treatment with medication several years ago but opted for psychotherapy. He never attended therapy.  He endorses daily worry, feeling anxious/nervous, experiences panic attacks (just prior to leading conference calls at work). The panic attacks occur once weekly, only at work. This has been present intermittently throughout his life. 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 GAD 7 score of 17 today. Denies feeling down, depressed or sadness.   2) Tick Bite: Located to right upper leg. He found a tick attached to his leg early Wednesday morning. He was working out in his yard Tuesday afternoon. Denies fevers, headaches, rashes, fatigue. The site does itch and has developed redness.  Review of Systems  Constitutional: Negative for fever, chills and fatigue.  Musculoskeletal: Negative for myalgias.  Skin: Positive for color change.       Itching  Neurological: Negative for headaches.       Past Medical History  Diagnosis Date  . Hypertension   . Hyperlipidemia   . Migraines   . Hemorrhoids      Social History   Social History  . Marital Status: Married    Spouse Name: N/A  . Number of Children: N/A  . Years of Education: N/A   Occupational History  . Not on file.   Social History Main Topics  . Smoking status: Former Research scientist (life sciences)  . Smokeless tobacco: Never Used  . Alcohol Use: 0.0 oz/week    0 Standard drinks or equivalent per week     Comment: occasionally  . Drug Use: No  . Sexual Activity: Not on file   Other Topics Concern  . Not on file   Social History  Narrative    Past Surgical History  Procedure Laterality Date  . Hemorrhoid banding      Family History  Problem Relation Age of Onset  . Diabetes Mother   . Hypertension Mother   . Hyperlipidemia Father   . [name] Parkinson White syndrome Brother   . Bone cancer Sister     No Known Allergies  Current Outpatient Prescriptions on File Prior to Visit  Medication Sig Dispense Refill  . cetirizine (ZYRTEC) 10 MG tablet Take 10 mg by mouth daily.    Marland Kitchen ezetimibe-simvastatin (VYTORIN) 10-10 MG tablet TAKE 1 TABLET ONCE A DAY ORALLY 90 tablet 3  . fluticasone (FLONASE) 50 MCG/ACT nasal spray Place 2 sprays into both nostrils daily. 16 g 1  . ketoconazole (NIZORAL) 2 % cream Apply 1 application topically 2 (two) times daily. 15 g 0  . lisinopril (PRINIVIL,ZESTRIL) 10 MG tablet TAKE 1 TABLET DAILY 90 tablet 1  . Menthol-Zinc Oxide (CALMOSEPTINE) 0.44-20.625 % OINT Apply 1 application topically 2 (two) times daily.    . promethazine (PHENERGAN) 25 MG tablet Take 25 mg by mouth every 6 (six) hours as needed for nausea.    . RELPAX 40 MG tablet TAKE 1 TABLET AS NEEDED FORMIGRAINE MAY REPEAT IN 2   HOURS IF NECESSARY 6 tablet 2  . topiramate (TOPAMAX) 50 MG tablet Take 1 tablet (50 mg total) by mouth at  bedtime. 90 tablet 3   No current facility-administered medications on file prior to visit.    BP 130/78 mmHg  Pulse 80  Temp(Src) 97.8 F (36.6 C) (Oral)  Wt 206 lb (93.441 kg)  SpO2 97%    Objective:   Physical Exam  Constitutional: He appears well-nourished.  Cardiovascular: Normal rate and regular rhythm.   Pulmonary/Chest: Effort normal and breath sounds normal.  Skin: Skin is warm and dry.  Very mild erythema to right anterior thigh from site of tickbite. No rashes, no s/s of infection. Non tender.  Psychiatric: He has a normal mood and affect.          Assessment & Plan:  Tick Bite:  Located to right upper anterior thigh. Mild erythema. No rashes. No s/s of acute  infection. Discussed supportive treatment such as cortisone cream for itching. Precautions provided for Lyme Disease and RMSF. He will report any of these findings.

## 2016-01-02 ENCOUNTER — Ambulatory Visit (INDEPENDENT_AMBULATORY_CARE_PROVIDER_SITE_OTHER): Payer: 59 | Admitting: Family Medicine

## 2016-01-02 VITALS — BP 110/80 | HR 60 | Temp 98.2°F | Wt 205.0 lb

## 2016-01-02 DIAGNOSIS — F4323 Adjustment disorder with mixed anxiety and depressed mood: Secondary | ICD-10-CM

## 2016-01-02 DIAGNOSIS — F411 Generalized anxiety disorder: Secondary | ICD-10-CM | POA: Diagnosis not present

## 2016-01-02 MED ORDER — SERTRALINE HCL 50 MG PO TABS: 50.0000 mg | ORAL_TABLET | Freq: Every day | ORAL | Status: DC

## 2016-01-02 NOTE — Assessment & Plan Note (Signed)
>  25 minutes spent in face to face time with patient, >50% spent in counselling or coordination of care Symptoms improving on current dose of zoloft. eRx refills sent. Follow up in 6 months. The patient indicates understanding of these issues and agrees with the plan.

## 2016-01-02 NOTE — Progress Notes (Signed)
Subjective:   Patient ID: Micheal Keller, male    DOB: [birth year], 45 y.o.   MRN: [id number]  Micheal Keller is a pleasant 45 y.o. year old male who presents to clinic today with Anxiety  on 01/02/2016  HPI:  Here for 4- 6 week follow up.  Saw my partner, Allie Bossier, NP on 12/07/15 for progressive symptoms of anxiety.  Note reviewed.  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  GAD was 7 out of 17.  Declined psychotherapy but agreed to rx.  Started on zoloft 50 mg mg daily along with rx for as needed xanax 0.25 mg.  He has not needed to take xanax at all.  Feels much better.  No further panic attacks, less anxious about work.  Has noticed some sexual side effects but he feels this is not severe.   Current Outpatient Prescriptions on File Prior to Visit  Medication Sig Dispense Refill  . ALPRAZolam (XANAX) 0.25 MG tablet Take 1 tablet (0.25 mg total) by mouth 2 (two) times daily as needed for anxiety. 20 tablet 0  . cetirizine (ZYRTEC) 10 MG tablet Take 10 mg by mouth daily.    Marland Kitchen ezetimibe-simvastatin (VYTORIN) 10-10 MG tablet TAKE 1 TABLET ONCE A DAY ORALLY 90 tablet 3  . fluticasone (FLONASE) 50 MCG/ACT nasal spray Place 2 sprays into both nostrils daily. 16 g 1  . ketoconazole (NIZORAL) 2 % cream Apply 1 application topically 2 (two) times daily. 15 g 0  . lisinopril (PRINIVIL,ZESTRIL) 10 MG tablet TAKE 1 TABLET DAILY 90 tablet 1  . Menthol-Zinc Oxide (CALMOSEPTINE) 0.44-20.625 % OINT Apply 1 application topically 2 (two) times daily.    . promethazine (PHENERGAN) 25 MG tablet Take 25 mg by mouth every 6 (six) hours as needed for nausea.    . RELPAX 40 MG tablet TAKE 1 TABLET AS NEEDED FORMIGRAINE MAY REPEAT IN 2   HOURS IF NECESSARY 6 tablet 2  . sertraline (ZOLOFT) 50 MG tablet Take 1 tablet (50 mg total) by mouth daily. 30 tablet 1  . topiramate (TOPAMAX) 50 MG tablet Take 1 tablet (50 mg total) by mouth at bedtime. 90  tablet 3   No current facility-administered medications on file prior to visit.    No Known Allergies  Past Medical History  Diagnosis Date  . Hypertension   . Hyperlipidemia   . Migraines   . Hemorrhoids     Past Surgical History  Procedure Laterality Date  . Hemorrhoid banding      Family History  Problem Relation Age of Onset  . Diabetes Mother   . Hypertension Mother   . Hyperlipidemia Father   . [name] Parkinson White syndrome Brother   . Bone cancer Sister     Social History   Social History  . Marital Status: Married    Spouse Name: N/A  . Number of Children: N/A  . Years of Education: N/A   Occupational History  . Not on file.   Social History Main Topics  . Smoking status: Former Research scientist (life sciences)  . Smokeless tobacco: Never Used  . Alcohol Use: 0.0 oz/week    0 Standard drinks or equivalent per week     Comment: occasionally  . Drug Use: No  . Sexual Activity: Not on file   Other Topics Concern  . Not on file   Social History Narrative   The PMH, PSH, Social History, Family History, Medications, and allergies have been reviewed in [facility],  and have been updated if relevant.    Review of Systems  Psychiatric/Behavioral: Negative for suicidal ideas, hallucinations, behavioral problems, confusion, sleep disturbance, self-injury, dysphoric mood, decreased concentration and agitation. The patient is not nervous/anxious and is not hyperactive.   All other systems reviewed and are negative.      Objective:    BP 110/80 mmHg  Pulse 60  Temp(Src) 98.2 F (36.8 C) (Oral)  Wt 205 lb (92.987 kg)  SpO2 98%   Physical Exam  Constitutional: He is oriented to person, place, and time. He appears well-developed and well-nourished. No distress.  HENT:  Head: Normocephalic.  Eyes: Conjunctivae are normal.  Cardiovascular: Normal rate.   Pulmonary/Chest: Effort normal.  Musculoskeletal: Normal range of motion.  Neurological: He is alert and oriented to person,  place, and time. No cranial nerve deficit.  Skin: Skin is warm and dry. He is not diaphoretic.  Psychiatric: He has a normal mood and affect. His behavior is normal. Judgment and thought content normal.  Nursing note and vitals reviewed.         Assessment & Plan:   Adjustment disorder with mixed anxiety and depressed mood  GAD (generalized anxiety disorder) No Follow-up on file.

## 2016-01-02 NOTE — Progress Notes (Signed)
Pre visit review using our clinic review tool, if applicable. No additional management support is needed unless otherwise documented below in the visit note. 

## 2016-04-09 ENCOUNTER — Other Ambulatory Visit: Payer: Self-pay | Admitting: Family Medicine

## 2016-04-29 ENCOUNTER — Other Ambulatory Visit: Payer: Self-pay

## 2016-05-06 ENCOUNTER — Other Ambulatory Visit: Payer: Self-pay

## 2016-05-06 MED ORDER — EZETIMIBE-SIMVASTATIN 10-10 MG PO TABS: ORAL_TABLET | ORAL | 1 refills | Status: DC

## 2016-05-06 NOTE — Telephone Encounter (Addendum)
Patient call on 04/29/16 regarding request to investigate his Vytorin refusal letter from [employer].  After investigation, I responded and entered in on 05/06/16 as medication refill.  Please refer to this note for further details.

## 2016-05-06 NOTE — Telephone Encounter (Deleted)
Patient calls reporting that he has received a letter from his mail order company that his Vytorin will not be filled again until his physician sends in a new R/X.  I noticed on 09/26/2015 a 90 day supply with 1 years refills was sent in for the ezetimibe-simvastatin.  I contacted mail order company.  They said that we ordered the brand name Vytorin and not the generic, which is not what I show and that the patient has not had filled since March.  In addition, patient's insurance will not cover the brand name which will cost him 600.00 dollars out of pocket.    Patient verifies that he has been taking the [place] as prescribed every day.  Not sure why mail order is showing different or why they need a new r/x for patient.   Bottom line:  I will send in another R/X to mail order clarifying fill generic and will approve #90, 1 refill to carry him through until the spring when he is due for follow up.

## 2016-05-06 NOTE — Telephone Encounter (Signed)
Patient calls reporting that he has received a letter from his mail order company that his Vytorin will not be filled again until his physician sends in a new R/X.  I noticed on 09/26/2015 a 90 day supply with 1 years refills was sent in for the ezetimibe-simvastatin.  I contacted mail order company.  They said that we ordered the brand name Vytorin and not the generic, which is not what I show and that the patient has not had filled since March.  In addition, patient's insurance will not cover the brand name which will cost him 600.00 dollars out of pocket.    Patient verifies that he has been taking the [place] as prescribed every day.  Not sure why mail order is showing different or why they need a new r/x for patient.   Bottom line:  I will send in another R/X to mail order clarifying fill generic and will approve #90, 1 refill to carry him through until the spring when he is due for follow up.

## 2016-05-07 NOTE — Telephone Encounter (Signed)
Thank you :)

## 2016-07-11 ENCOUNTER — Encounter: Payer: Self-pay | Admitting: Family Medicine

## 2016-08-03 ENCOUNTER — Encounter: Payer: Self-pay | Admitting: Family Medicine

## 2016-08-26 ENCOUNTER — Ambulatory Visit (INDEPENDENT_AMBULATORY_CARE_PROVIDER_SITE_OTHER): Payer: 59 | Admitting: Family Medicine

## 2016-08-26 ENCOUNTER — Encounter: Payer: Self-pay | Admitting: Family Medicine

## 2016-08-26 VITALS — BP 142/90 | HR 91 | Temp 98.5°F | Wt 218.0 lb

## 2016-08-26 DIAGNOSIS — J0110 Acute frontal sinusitis, unspecified: Secondary | ICD-10-CM | POA: Diagnosis not present

## 2016-08-26 MED ORDER — AMOXICILLIN-POT CLAVULANATE 875-125 MG PO TABS: 1.0000 | ORAL_TABLET | Freq: Two times a day (BID) | ORAL | 0 refills | Status: AC

## 2016-08-26 MED ORDER — LEVOCETIRIZINE DIHYDROCHLORIDE 5 MG PO TABS: 5.0000 mg | ORAL_TABLET | Freq: Every evening | ORAL | 3 refills | Status: DC

## 2016-08-26 NOTE — Progress Notes (Signed)
Pre visit review using our clinic review tool, if applicable. No additional management support is needed unless otherwise documented below in the visit note. 

## 2016-08-26 NOTE — Patient Instructions (Signed)
Great to see you.  Please take Augmentin as directed- 1 tablet twice daily x 10 days.  Add zyzal nightly.

## 2016-08-26 NOTE — Progress Notes (Signed)
SUBJECTIVE:  Micheal Keller is a 46 y.o. male who complains of coryza, congestion, post nasal drip and bilateral sinus pain for 10 days. He denies a history of anorexia and chest pain and denies a history of asthma. Patient denies smoke cigarettes.  Does have a h/o allergic rhinitis- has been taking zyrtec and using flonase. Symptoms have been worse this year despite compliance with these rxs.   Current Outpatient Prescriptions on File Prior to Visit  Medication Sig Dispense Refill  . ALPRAZolam (XANAX) 0.25 MG tablet Take 1 tablet (0.25 mg total) by mouth 2 (two) times daily as needed for anxiety. 20 tablet 0  . cetirizine (ZYRTEC) 10 MG tablet Take 10 mg by mouth daily.    Marland Kitchen ezetimibe-simvastatin (VYTORIN) 10-10 MG tablet TAKE 1 TABLET ONCE A DAY ORALLY 90 tablet 1  . fluticasone (FLONASE) 50 MCG/ACT nasal spray Place 2 sprays into both nostrils daily. 16 g 1  . ketoconazole (NIZORAL) 2 % cream Apply 1 application topically 2 (two) times daily. 15 g 0  . lisinopril (PRINIVIL,ZESTRIL) 10 MG tablet TAKE 1 TABLET DAILY 90 tablet 1  . Menthol-Zinc Oxide (CALMOSEPTINE) 0.44-20.625 % OINT Apply 1 application topically 2 (two) times daily.    . promethazine (PHENERGAN) 25 MG tablet Take 25 mg by mouth every 6 (six) hours as needed for nausea.    . RELPAX 40 MG tablet TAKE 1 TABLET AS NEEDED FORMIGRAINE MAY REPEAT IN 2   HOURS IF NECESSARY 6 tablet 2  . sertraline (ZOLOFT) 50 MG tablet Take 1 tablet (50 mg total) by mouth daily. 90 tablet 3  . topiramate (TOPAMAX) 50 MG tablet Take 1 tablet (50 mg total) by mouth at bedtime. 90 tablet 3   No current facility-administered medications on file prior to visit.     No Known Allergies  Past Medical History:  Diagnosis Date  . Hemorrhoids   . Hyperlipidemia   . Hypertension   . Migraines     Past Surgical History:  Procedure Laterality Date  . HEMORRHOID BANDING      Family History  Problem Relation Age of Onset  . Diabetes Mother   .  Hypertension Mother   . Hyperlipidemia Father   . [name] Parkinson White syndrome Brother   . Bone cancer Sister     Social History   Social History  . Marital status: Married    Spouse name: N/A  . Number of children: N/A  . Years of education: N/A   Occupational History  . Not on file.   Social History Main Topics  . Smoking status: Former Research scientist (life sciences)  . Smokeless tobacco: Never Used  . Alcohol use 0.0 oz/week     Comment: occasionally  . Drug use: No  . Sexual activity: Not on file   Other Topics Concern  . Not on file   Social History Narrative  . No narrative on file   The PMH, PSH, Social History, Family History, Medications, and allergies have been reviewed in [facility], and have been updated if relevant.  OBJECTIVE: BP (!) 142/90   Pulse 91   Temp 98.5 F (36.9 C) (Oral)   Wt 218 lb (98.9 kg)   SpO2 98%   BMI 31.28 kg/m   He appears well, vital signs are as noted. Ears normal.  Throat and pharynx normal.  Neck supple. No adenopathy in the neck. Nose is congested. Sinuses tender. The chest is clear, without wheezes or rales.  ASSESSMENT:  sinusitis and allergic rhinitis  PLAN: Given  duration and progression of symptoms, will treat for bacterial sinusitis with Augmentin. D/c zyrtec, eRx sent for zyzal. Symptomatic therapy suggested: push fluids, rest and return office visit prn if symptoms persist or worsen. . Call or return to clinic prn if these symptoms worsen or fail to improve as anticipated.

## 2016-10-10 ENCOUNTER — Encounter: Payer: Self-pay | Admitting: Family Medicine

## 2016-10-10 MED ORDER — EZETIMIBE-SIMVASTATIN 10-10 MG PO TABS: ORAL_TABLET | ORAL | 0 refills | Status: DC

## 2016-10-10 NOTE — Telephone Encounter (Signed)
1 rx sent. I sent pt a MyChart message back stating he needs a CPE

## 2016-10-25 ENCOUNTER — Other Ambulatory Visit: Payer: Self-pay | Admitting: Family Medicine

## 2016-11-12 ENCOUNTER — Other Ambulatory Visit: Payer: Self-pay | Admitting: Family Medicine

## 2016-11-12 DIAGNOSIS — Z0000 Encounter for general adult medical examination without abnormal findings: Secondary | ICD-10-CM | POA: Insufficient documentation

## 2016-11-12 DIAGNOSIS — E785 Hyperlipidemia, unspecified: Secondary | ICD-10-CM

## 2016-11-12 DIAGNOSIS — I10 Essential (primary) hypertension: Secondary | ICD-10-CM

## 2016-11-13 ENCOUNTER — Other Ambulatory Visit (INDEPENDENT_AMBULATORY_CARE_PROVIDER_SITE_OTHER): Payer: 59

## 2016-11-13 DIAGNOSIS — Z0000 Encounter for general adult medical examination without abnormal findings: Secondary | ICD-10-CM

## 2016-11-13 LAB — COMPREHENSIVE METABOLIC PANEL
ALBUMIN: 4.5 g/dL (ref 3.5–5.2)
ALT: 29 U/L (ref 0–53)
AST: 26 U/L (ref 0–37)
Alkaline Phosphatase: 60 U/L (ref 39–117)
BUN: 12 mg/dL (ref 6–23)
CHLORIDE: 106 meq/L (ref 96–112)
CO2: 27 meq/L (ref 19–32)
CREATININE: 1.05 mg/dL (ref 0.40–1.50)
Calcium: 9.4 mg/dL (ref 8.4–10.5)
GFR: 98.64 mL/min (ref >60.00)
GLUCOSE: 109 mg/dL — AB (ref 70–99)
Potassium: 3.7 mEq/L (ref 3.5–5.1)
SODIUM: 139 meq/L (ref 135–145)
Total Bilirubin: 0.5 mg/dL (ref 0.2–1.2)
Total Protein: 6.9 g/dL (ref 6.0–8.3)

## 2016-11-13 LAB — LIPID PANEL
CHOL/HDL RATIO: 4
CHOLESTEROL: 186 mg/dL (ref 0–200)
HDL: 41.6 mg/dL (ref >39.00)
LDL CALC: 128 mg/dL — AB (ref 0–99)
NONHDL: 144.3
Triglycerides: 84 mg/dL (ref 0.0–149.0)
VLDL: 16.8 mg/dL (ref 0.0–40.0)

## 2016-11-13 LAB — PSA: PSA: 0.82 ng/mL (ref 0.10–4.00)

## 2016-11-18 ENCOUNTER — Ambulatory Visit (INDEPENDENT_AMBULATORY_CARE_PROVIDER_SITE_OTHER): Payer: 59 | Admitting: Family Medicine

## 2016-11-18 ENCOUNTER — Encounter: Payer: Self-pay | Admitting: Family Medicine

## 2016-11-18 VITALS — BP 132/82 | HR 58 | Ht 70.25 in | Wt 216.0 lb

## 2016-11-18 DIAGNOSIS — I10 Essential (primary) hypertension: Secondary | ICD-10-CM | POA: Diagnosis not present

## 2016-11-18 DIAGNOSIS — E785 Hyperlipidemia, unspecified: Secondary | ICD-10-CM | POA: Diagnosis not present

## 2016-11-18 DIAGNOSIS — F4323 Adjustment disorder with mixed anxiety and depressed mood: Secondary | ICD-10-CM

## 2016-11-18 DIAGNOSIS — Z0000 Encounter for general adult medical examination without abnormal findings: Secondary | ICD-10-CM

## 2016-11-18 DIAGNOSIS — F411 Generalized anxiety disorder: Secondary | ICD-10-CM

## 2016-11-18 MED ORDER — ELETRIPTAN HYDROBROMIDE 40 MG PO TABS: ORAL_TABLET | ORAL | 2 refills | Status: DC

## 2016-11-18 MED ORDER — EZETIMIBE-SIMVASTATIN 10-10 MG PO TABS: ORAL_TABLET | ORAL | 0 refills | Status: DC

## 2016-11-18 MED ORDER — LISINOPRIL 10 MG PO TABS: 10.0000 mg | ORAL_TABLET | Freq: Every day | ORAL | 1 refills | Status: DC

## 2016-11-18 MED ORDER — SERTRALINE HCL 50 MG PO TABS: 50.0000 mg | ORAL_TABLET | Freq: Every day | ORAL | 3 refills | Status: DC

## 2016-11-18 MED ORDER — TOPIRAMATE 50 MG PO TABS: 50.0000 mg | ORAL_TABLET | Freq: Every day | ORAL | 0 refills | Status: DC

## 2016-11-18 MED ORDER — FLUTICASONE PROPIONATE 50 MCG/ACT NA SUSP: 2.0000 | Freq: Every day | NASAL | 1 refills | Status: DC

## 2016-11-18 NOTE — Assessment & Plan Note (Signed)
Well controlled on current dose of zoloft. No changes made today. 

## 2016-11-18 NOTE — Progress Notes (Signed)
Subjective:   Patient ID: Micheal Keller, male    DOB: 01-20-1971, 46 y.o.   MRN: [id number]  Micheal Keller is a pleasant 46 y.o. year old male who presents to clinic today with Annual Exam and Medication Refill  on 11/18/2016  HPI:  Doing well.  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  Now a Holiday representative.  HLD- has been taking Vytorin daily.  Admits to falling off the wagon in terms of his exercise routine. Lab Results  Component Value Date   CHOL 186 11/13/2016   HDL 41.60 11/13/2016   LDLCALC 128 (H) 11/13/2016   TRIG 84.0 11/13/2016   CHOLHDL 4 11/13/2016   HTN- d Has been taking Lisinopril 10 mg daily. BP Readings from Last 3 Encounters:  11/18/16 132/82  08/26/16 (!) 142/90  01/02/16 110/80   Lab Results  Component Value Date   CREATININE 1.05 11/13/2016   Lab Results  Component Value Date   NA 139 11/13/2016   K 3.7 11/13/2016   CL 106 11/13/2016   CO2 27 11/13/2016   Anxiety- symptoms improved with zoloft 50 mg daily.  Migraines- Well controlled "Few and far between!" Current Outpatient Prescriptions on File Prior to Visit  Medication Sig Dispense Refill  . ALPRAZolam (XANAX) 0.25 MG tablet Take 1 tablet (0.25 mg total) by mouth 2 (two) times daily as needed for anxiety. 20 tablet 0  . cetirizine (ZYRTEC) 10 MG tablet Take 10 mg by mouth daily.    Marland Kitchen ezetimibe-simvastatin (VYTORIN) 10-10 MG tablet TAKE 1 TABLET ONCE A DAY ORALLY 90 tablet 0  . fluticasone (FLONASE) 50 MCG/ACT nasal spray Place 2 sprays into both nostrils daily. 16 g 1  . ketoconazole (NIZORAL) 2 % cream Apply 1 application topically 2 (two) times daily. 15 g 0  . levocetirizine (XYZAL) 5 MG tablet Take 1 tablet (5 mg total) by mouth every evening. 30 tablet 3  . lisinopril (PRINIVIL,ZESTRIL) 10 MG tablet TAKE 1 TABLET DAILY 90 tablet 1  . Menthol-Zinc Oxide (CALMOSEPTINE) 0.44-20.625 % OINT Apply 1 application topically 2 (two) times daily.    . promethazine (PHENERGAN) 25 MG tablet Take 25 mg by mouth every  6 (six) hours as needed for nausea.    . RELPAX 40 MG tablet TAKE 1 TABLET AS NEEDED FORMIGRAINE MAY REPEAT IN 2   HOURS IF NECESSARY 6 tablet 2  . sertraline (ZOLOFT) 50 MG tablet Take 1 tablet (50 mg total) by mouth daily. 90 tablet 3  . topiramate (TOPAMAX) 50 MG tablet TAKE 1 TABLET AT BEDTIME 90 tablet 0   No current facility-administered medications on file prior to visit.     No Known Allergies  Past Medical History:  Diagnosis Date  . Hemorrhoids   . Hyperlipidemia   . Hypertension   . Migraines     Past Surgical History:  Procedure Laterality Date  . HEMORRHOID BANDING      Family History  Problem Relation Age of Onset  . Diabetes Mother   . Hypertension Mother   . Hyperlipidemia Father   . [name] Parkinson White syndrome Brother   . Bone cancer Sister     Social History   Social History  . Marital status: Married    Spouse name: N/A  . Number of children: N/A  . Years of education: N/A   Occupational History  . Not on file.   Social History Main Topics  . Smoking status: Former Research scientist (life sciences)  . Smokeless tobacco: Never Used  . Alcohol use  0.0 oz/week     Comment: couple drinks a week   . Drug use: No  . Sexual activity: Not on file   Other Topics Concern  . Not on file   Social History Narrative  . No narrative on file   The PMH, PSH, Social History, Family History, Medications, and allergies have been reviewed in [facility], and have been updated if relevant.   Review of Systems  Constitutional: Negative.   HENT: Negative.   Eyes: Negative.   Respiratory: Negative.   Cardiovascular: Negative.   Gastrointestinal: Negative.   Genitourinary: Negative.   Musculoskeletal: Negative.   Allergic/Immunologic: Negative.   Neurological: Negative.   Hematological: Negative.   Psychiatric/Behavioral: Negative.   All other systems reviewed and are negative.      Objective:    BP 132/82   Pulse (!) 58   Ht 5' 10.25" (1.784 m)   Wt 216 lb 0.1 oz (98 kg)    SpO2 99%   BMI 30.77 kg/m  Wt Readings from Last 3 Encounters:  11/18/16 216 lb 0.1 oz (98 kg)  08/26/16 218 lb (98.9 kg)  01/02/16 205 lb (93 kg)     Physical Exam   General:  pleasant male in no acute distress Eyes:  PERRL Ears:  External ear exam shows no significant lesions or deformities.  TMs normal bilaterally Hearing is grossly normal bilaterally. Nose:  External nasal examination shows no deformity or inflammation. Nasal mucosa are pink and moist without lesions or exudates. Mouth:  Oral mucosa and oropharynx without lesions or exudates.  Teeth in good repair. Neck:  no carotid bruit or thyromegaly no cervical or supraclavicular lymphadenopathy  Lungs:  Normal respiratory effort, chest expands symmetrically. Lungs are clear to auscultation, no crackles or wheezes. Heart:  Normal rate and regular rhythm. S1 and S2 normal without gallop, murmur, click, rub or other extra sounds. Abdomen:  Bowel sounds positive,abdomen soft and non-tender without masses, organomegaly or hernias noted. Pulses:  R and L posterior tibial pulses are full and equal bilaterally  Extremities:  no edema  Psych:  Good eye contact, not anxious or depressed appearing      Assessment & Plan:   Visit for well man health check  Essential hypertension  Hyperlipidemia, unspecified hyperlipidemia type  Adjustment disorder with mixed anxiety and depressed mood  Generalized anxiety disorder - Plan: sertraline (ZOLOFT) 50 MG tablet No Follow-up on file.

## 2016-11-18 NOTE — Assessment & Plan Note (Signed)
Well controlled. No changes made today. 

## 2016-11-18 NOTE — Assessment & Plan Note (Signed)
Reasonable control.  He plans to get back to the gym again. No changes made to rxs.

## 2016-11-18 NOTE — Assessment & Plan Note (Signed)
Reviewed preventive care protocols, scheduled due services, and updated immunizations Discussed nutrition, exercise, diet, and healthy lifestyle.  

## 2016-11-26 ENCOUNTER — Ambulatory Visit: Payer: Self-pay | Admitting: Family Medicine

## 2017-01-31 DIAGNOSIS — H9201 Otalgia, right ear: Secondary | ICD-10-CM | POA: Diagnosis not present

## 2017-01-31 DIAGNOSIS — H66001 Acute suppurative otitis media without spontaneous rupture of ear drum, right ear: Secondary | ICD-10-CM | POA: Diagnosis not present

## 2017-02-04 DIAGNOSIS — H9201 Otalgia, right ear: Secondary | ICD-10-CM | POA: Diagnosis not present

## 2017-02-04 DIAGNOSIS — H66001 Acute suppurative otitis media without spontaneous rupture of ear drum, right ear: Secondary | ICD-10-CM | POA: Diagnosis not present

## 2017-02-22 ENCOUNTER — Encounter: Payer: Self-pay | Admitting: Family Medicine

## 2017-02-23 ENCOUNTER — Other Ambulatory Visit: Payer: Self-pay | Admitting: Family Medicine

## 2017-02-23 DIAGNOSIS — J302 Other seasonal allergic rhinitis: Secondary | ICD-10-CM

## 2017-03-04 DIAGNOSIS — T63441A Toxic effect of venom of bees, accidental (unintentional), initial encounter: Secondary | ICD-10-CM | POA: Diagnosis not present

## 2017-03-04 DIAGNOSIS — J301 Allergic rhinitis due to pollen: Secondary | ICD-10-CM | POA: Diagnosis not present

## 2017-03-04 DIAGNOSIS — J3089 Other allergic rhinitis: Secondary | ICD-10-CM | POA: Diagnosis not present

## 2017-03-24 DIAGNOSIS — J3081 Allergic rhinitis due to animal (cat) (dog) hair and dander: Secondary | ICD-10-CM | POA: Diagnosis not present

## 2017-03-24 DIAGNOSIS — J301 Allergic rhinitis due to pollen: Secondary | ICD-10-CM | POA: Diagnosis not present

## 2017-03-25 DIAGNOSIS — J3089 Other allergic rhinitis: Secondary | ICD-10-CM | POA: Diagnosis not present

## 2017-04-20 ENCOUNTER — Other Ambulatory Visit: Payer: Self-pay | Admitting: Family Medicine

## 2017-04-23 DIAGNOSIS — J3089 Other allergic rhinitis: Secondary | ICD-10-CM | POA: Diagnosis not present

## 2017-04-23 DIAGNOSIS — J3081 Allergic rhinitis due to animal (cat) (dog) hair and dander: Secondary | ICD-10-CM | POA: Diagnosis not present

## 2017-04-23 DIAGNOSIS — J301 Allergic rhinitis due to pollen: Secondary | ICD-10-CM | POA: Diagnosis not present

## 2017-04-28 DIAGNOSIS — Z23 Encounter for immunization: Secondary | ICD-10-CM | POA: Diagnosis not present

## 2017-04-28 DIAGNOSIS — J3081 Allergic rhinitis due to animal (cat) (dog) hair and dander: Secondary | ICD-10-CM | POA: Diagnosis not present

## 2017-04-28 DIAGNOSIS — J3089 Other allergic rhinitis: Secondary | ICD-10-CM | POA: Diagnosis not present

## 2017-04-28 DIAGNOSIS — J301 Allergic rhinitis due to pollen: Secondary | ICD-10-CM | POA: Diagnosis not present

## 2017-04-30 DIAGNOSIS — J3089 Other allergic rhinitis: Secondary | ICD-10-CM | POA: Diagnosis not present

## 2017-04-30 DIAGNOSIS — J301 Allergic rhinitis due to pollen: Secondary | ICD-10-CM | POA: Diagnosis not present

## 2017-04-30 DIAGNOSIS — J3081 Allergic rhinitis due to animal (cat) (dog) hair and dander: Secondary | ICD-10-CM | POA: Diagnosis not present

## 2017-05-01 ENCOUNTER — Other Ambulatory Visit: Payer: Self-pay | Admitting: Family Medicine

## 2017-05-04 NOTE — Telephone Encounter (Signed)
On 10.15.18 #90+2 refills was faxed/thx dmf

## 2017-05-06 DIAGNOSIS — J3081 Allergic rhinitis due to animal (cat) (dog) hair and dander: Secondary | ICD-10-CM | POA: Diagnosis not present

## 2017-05-06 DIAGNOSIS — J3089 Other allergic rhinitis: Secondary | ICD-10-CM | POA: Diagnosis not present

## 2017-05-06 DIAGNOSIS — J301 Allergic rhinitis due to pollen: Secondary | ICD-10-CM | POA: Diagnosis not present

## 2017-05-08 DIAGNOSIS — J301 Allergic rhinitis due to pollen: Secondary | ICD-10-CM | POA: Diagnosis not present

## 2017-05-08 DIAGNOSIS — J3081 Allergic rhinitis due to animal (cat) (dog) hair and dander: Secondary | ICD-10-CM | POA: Diagnosis not present

## 2017-05-08 DIAGNOSIS — J3089 Other allergic rhinitis: Secondary | ICD-10-CM | POA: Diagnosis not present

## 2017-05-10 ENCOUNTER — Other Ambulatory Visit: Payer: Self-pay | Admitting: Family Medicine

## 2017-05-12 DIAGNOSIS — J301 Allergic rhinitis due to pollen: Secondary | ICD-10-CM | POA: Diagnosis not present

## 2017-05-12 DIAGNOSIS — J3081 Allergic rhinitis due to animal (cat) (dog) hair and dander: Secondary | ICD-10-CM | POA: Diagnosis not present

## 2017-05-12 DIAGNOSIS — J3089 Other allergic rhinitis: Secondary | ICD-10-CM | POA: Diagnosis not present

## 2017-05-15 DIAGNOSIS — J3081 Allergic rhinitis due to animal (cat) (dog) hair and dander: Secondary | ICD-10-CM | POA: Diagnosis not present

## 2017-05-15 DIAGNOSIS — J3089 Other allergic rhinitis: Secondary | ICD-10-CM | POA: Diagnosis not present

## 2017-05-15 DIAGNOSIS — J301 Allergic rhinitis due to pollen: Secondary | ICD-10-CM | POA: Diagnosis not present

## 2017-05-19 DIAGNOSIS — J3081 Allergic rhinitis due to animal (cat) (dog) hair and dander: Secondary | ICD-10-CM | POA: Diagnosis not present

## 2017-05-19 DIAGNOSIS — J301 Allergic rhinitis due to pollen: Secondary | ICD-10-CM | POA: Diagnosis not present

## 2017-05-19 DIAGNOSIS — J3089 Other allergic rhinitis: Secondary | ICD-10-CM | POA: Diagnosis not present

## 2017-05-21 DIAGNOSIS — J3081 Allergic rhinitis due to animal (cat) (dog) hair and dander: Secondary | ICD-10-CM | POA: Diagnosis not present

## 2017-05-21 DIAGNOSIS — J3089 Other allergic rhinitis: Secondary | ICD-10-CM | POA: Diagnosis not present

## 2017-05-21 DIAGNOSIS — J301 Allergic rhinitis due to pollen: Secondary | ICD-10-CM | POA: Diagnosis not present

## 2017-05-26 DIAGNOSIS — J301 Allergic rhinitis due to pollen: Secondary | ICD-10-CM | POA: Diagnosis not present

## 2017-05-26 DIAGNOSIS — J3089 Other allergic rhinitis: Secondary | ICD-10-CM | POA: Diagnosis not present

## 2017-05-26 DIAGNOSIS — J3081 Allergic rhinitis due to animal (cat) (dog) hair and dander: Secondary | ICD-10-CM | POA: Diagnosis not present

## 2017-06-02 DIAGNOSIS — J301 Allergic rhinitis due to pollen: Secondary | ICD-10-CM | POA: Diagnosis not present

## 2017-06-02 DIAGNOSIS — J3081 Allergic rhinitis due to animal (cat) (dog) hair and dander: Secondary | ICD-10-CM | POA: Diagnosis not present

## 2017-06-02 DIAGNOSIS — J3089 Other allergic rhinitis: Secondary | ICD-10-CM | POA: Diagnosis not present

## 2017-06-04 DIAGNOSIS — J3081 Allergic rhinitis due to animal (cat) (dog) hair and dander: Secondary | ICD-10-CM | POA: Diagnosis not present

## 2017-06-04 DIAGNOSIS — J3089 Other allergic rhinitis: Secondary | ICD-10-CM | POA: Diagnosis not present

## 2017-06-04 DIAGNOSIS — J301 Allergic rhinitis due to pollen: Secondary | ICD-10-CM | POA: Diagnosis not present

## 2017-06-09 DIAGNOSIS — J3089 Other allergic rhinitis: Secondary | ICD-10-CM | POA: Diagnosis not present

## 2017-06-09 DIAGNOSIS — J301 Allergic rhinitis due to pollen: Secondary | ICD-10-CM | POA: Diagnosis not present

## 2017-06-09 DIAGNOSIS — J3081 Allergic rhinitis due to animal (cat) (dog) hair and dander: Secondary | ICD-10-CM | POA: Diagnosis not present

## 2017-06-11 DIAGNOSIS — J301 Allergic rhinitis due to pollen: Secondary | ICD-10-CM | POA: Diagnosis not present

## 2017-06-11 DIAGNOSIS — J3081 Allergic rhinitis due to animal (cat) (dog) hair and dander: Secondary | ICD-10-CM | POA: Diagnosis not present

## 2017-06-11 DIAGNOSIS — J3089 Other allergic rhinitis: Secondary | ICD-10-CM | POA: Diagnosis not present

## 2017-06-16 DIAGNOSIS — J3081 Allergic rhinitis due to animal (cat) (dog) hair and dander: Secondary | ICD-10-CM | POA: Diagnosis not present

## 2017-06-16 DIAGNOSIS — J301 Allergic rhinitis due to pollen: Secondary | ICD-10-CM | POA: Diagnosis not present

## 2017-06-16 DIAGNOSIS — J3089 Other allergic rhinitis: Secondary | ICD-10-CM | POA: Diagnosis not present

## 2017-06-19 DIAGNOSIS — J3089 Other allergic rhinitis: Secondary | ICD-10-CM | POA: Diagnosis not present

## 2017-06-19 DIAGNOSIS — J3081 Allergic rhinitis due to animal (cat) (dog) hair and dander: Secondary | ICD-10-CM | POA: Diagnosis not present

## 2017-06-19 DIAGNOSIS — J301 Allergic rhinitis due to pollen: Secondary | ICD-10-CM | POA: Diagnosis not present

## 2017-06-23 DIAGNOSIS — J3089 Other allergic rhinitis: Secondary | ICD-10-CM | POA: Diagnosis not present

## 2017-06-23 DIAGNOSIS — J301 Allergic rhinitis due to pollen: Secondary | ICD-10-CM | POA: Diagnosis not present

## 2017-06-23 DIAGNOSIS — J3081 Allergic rhinitis due to animal (cat) (dog) hair and dander: Secondary | ICD-10-CM | POA: Diagnosis not present

## 2017-06-25 DIAGNOSIS — J301 Allergic rhinitis due to pollen: Secondary | ICD-10-CM | POA: Diagnosis not present

## 2017-06-25 DIAGNOSIS — J3089 Other allergic rhinitis: Secondary | ICD-10-CM | POA: Diagnosis not present

## 2017-06-25 DIAGNOSIS — J3081 Allergic rhinitis due to animal (cat) (dog) hair and dander: Secondary | ICD-10-CM | POA: Diagnosis not present

## 2017-07-01 DIAGNOSIS — J3089 Other allergic rhinitis: Secondary | ICD-10-CM | POA: Diagnosis not present

## 2017-07-01 DIAGNOSIS — J3081 Allergic rhinitis due to animal (cat) (dog) hair and dander: Secondary | ICD-10-CM | POA: Diagnosis not present

## 2017-07-01 DIAGNOSIS — J301 Allergic rhinitis due to pollen: Secondary | ICD-10-CM | POA: Diagnosis not present

## 2017-07-03 DIAGNOSIS — J301 Allergic rhinitis due to pollen: Secondary | ICD-10-CM | POA: Diagnosis not present

## 2017-07-03 DIAGNOSIS — J3089 Other allergic rhinitis: Secondary | ICD-10-CM | POA: Diagnosis not present

## 2017-07-08 DIAGNOSIS — J3089 Other allergic rhinitis: Secondary | ICD-10-CM | POA: Diagnosis not present

## 2017-07-08 DIAGNOSIS — J3081 Allergic rhinitis due to animal (cat) (dog) hair and dander: Secondary | ICD-10-CM | POA: Diagnosis not present

## 2017-07-08 DIAGNOSIS — J301 Allergic rhinitis due to pollen: Secondary | ICD-10-CM | POA: Diagnosis not present

## 2017-07-10 ENCOUNTER — Ambulatory Visit: Payer: 59 | Admitting: Family Medicine

## 2017-07-10 ENCOUNTER — Encounter: Payer: Self-pay | Admitting: Family Medicine

## 2017-07-10 VITALS — BP 134/68 | HR 64 | Temp 98.6°F | Ht 70.0 in | Wt 213.0 lb

## 2017-07-10 DIAGNOSIS — J069 Acute upper respiratory infection, unspecified: Secondary | ICD-10-CM | POA: Insufficient documentation

## 2017-07-10 NOTE — Progress Notes (Signed)
Micheal Keller - 47 y.o. male MRN [id number]  Date of birth: 1971-04-17  SUBJECTIVE:  Including CC & ROS.  Chief Complaint  Patient presents with  . Sinusitis    Micheal Keller is a 47 y.o. male that is presenting with sinus pressure. He has drainage and noticed a tinge of blood when blowing his nose. He took some sudafed with some improvement in his congestion. Denies fevers or chills.  Symptoms have been present roughly 1 week.  He feels better today as compared to Wednesday.  Has not been around anyone with similar symptoms.  Has any body aches.   Review of Systems  Constitutional: Negative for fever.  HENT: Positive for sinus pressure and sinus pain.   Respiratory: Negative for cough.   Cardiovascular: Negative for chest pain.  Gastrointestinal: Negative for abdominal pain.  Musculoskeletal: Negative for gait problem.  Skin: Negative for color change.    HISTORY: Past Medical, Surgical, Social, and Family History Reviewed & Updated per EMR.   Pertinent Historical Findings include:  Past Medical History:  Diagnosis Date  . Hemorrhoids   . Hyperlipidemia   . Hypertension   . Migraines     Past Surgical History:  Procedure Laterality Date  . HEMORRHOID BANDING      No Known Allergies  Family History  Problem Relation Age of Onset  . Diabetes Mother   . Hypertension Mother   . Hyperlipidemia Father   . [name] Parkinson White syndrome Brother   . Bone cancer Sister      Social History   Socioeconomic History  . Marital status: Married    Spouse name: Not on file  . Number of children: Not on file  . Years of education: Not on file  . Highest education level: Not on file  Social Needs  . Financial resource strain: Not on file  . Food insecurity - worry: Not on file  . Food insecurity - inability: Not on file  . Transportation needs - medical: Not on file  . Transportation needs - non-medical: Not on file  Occupational History  . Not on file  Tobacco Use  . Smoking  status: Former Research scientist (life sciences)  . Smokeless tobacco: Never Used  Substance and Sexual Activity  . Alcohol use: Yes    Alcohol/week: 0.0 oz    Comment: couple drinks a week   . Drug use: No  . Sexual activity: Not on file  Other Topics Concern  . Not on file  Social History Narrative  . Not on file     PHYSICAL EXAM:  VS: BP 134/68 (BP Location: Left Arm, Patient Position: Sitting, Cuff Size: Normal)   Pulse 64   Temp 98.6 F (37 C) (Oral)   Ht 5\' 10"  (1.778 m)   Wt 213 lb (96.6 kg)   SpO2 100%   BMI 30.56 kg/m  Physical Exam Gen: NAD, alert, cooperative with exam, well-appearing ENT: normal lips, normal nasal mucosa, tympanic membranes clear and intact bilaterally, normal oropharynx, no cervical lymphadenopathy Eye: normal EOM, normal conjunctiva and lids CV:  no edema, +2 pedal pulses, regular rate and rhythm, S1-S2   Resp: no accessory muscle use, non-labored, clear to auscultation bilaterally, no crackles or wheezes GI: no masses or tenderness, no hernia  Skin: no rashes, no areas of induration  Neuro: normal tone, normal sensation to touch Psych:  normal insight, alert and oriented MSK: Normal gait, normal strength       ASSESSMENT & PLAN:   Upper respiratory tract infection Likely  viral in nature.  - counseled on supportive care.  - f/u PRN.

## 2017-07-10 NOTE — Assessment & Plan Note (Signed)
Likely viral in nature.  - counseled on supportive care.  - f/u PRN.

## 2017-07-10 NOTE — Patient Instructions (Signed)
Please try things such as zyrtec-D or allegra-D which is an antihistamine and decongestant.   Please try afrin which will help with nasal congestion but use for only three days.   Please also try using a netti pot on a regular occasion.  Honey can help with a sore throat.   Vick's can help with congestion

## 2017-07-15 DIAGNOSIS — J3081 Allergic rhinitis due to animal (cat) (dog) hair and dander: Secondary | ICD-10-CM | POA: Diagnosis not present

## 2017-07-15 DIAGNOSIS — J3089 Other allergic rhinitis: Secondary | ICD-10-CM | POA: Diagnosis not present

## 2017-07-15 DIAGNOSIS — J301 Allergic rhinitis due to pollen: Secondary | ICD-10-CM | POA: Diagnosis not present

## 2017-07-20 ENCOUNTER — Encounter: Payer: Self-pay | Admitting: Family Medicine

## 2017-07-20 ENCOUNTER — Ambulatory Visit: Payer: 59 | Admitting: Family Medicine

## 2017-07-20 DIAGNOSIS — N489 Disorder of penis, unspecified: Secondary | ICD-10-CM

## 2017-07-20 MED ORDER — CLOTRIMAZOLE 1 % EX OINT: TOPICAL_OINTMENT | CUTANEOUS | 0 refills | Status: DC

## 2017-07-20 NOTE — Progress Notes (Signed)
Subjective:   Patient ID: Micheal Keller, male    DOB: [birth year], 47 y.o.   MRN: [id number]  Micheal Keller is a pleasant 47 y.o. year old male who presents to clinic today with Abrasion (Patient is here today C/O a cut on the bottom right side rim of penis.  Presented 1 week ago.  Also there are small bumps that are itchy and discolored.  He has Tx with Neosporin but Sx persist. Pt also states that he is receiving allergy shots and is on maintenance with that now as well as a nasal spray that he does not know the name of from [place].)  on 07/20/2017  HPI:  Cut just below glands of his penis- noticed it about a week ago.  Now there are itchy and discolored areas around it. Denies any penile discharge. Has tried otc neosporin on it without any improvement of symptoms.   Current Outpatient Medications on File Prior to Visit  Medication Sig Dispense Refill  . ALPRAZolam (XANAX) 0.25 MG tablet Take 1 tablet (0.25 mg total) by mouth 2 (two) times daily as needed for anxiety. 20 tablet 0  . cetirizine (ZYRTEC) 10 MG tablet Take 10 mg by mouth daily.    Marland Kitchen eletriptan (RELPAX) 40 MG tablet TAKE 1 TABLET AS NEEDED FORMIGRAINE MAY REPEAT IN 2   HOURS IF NECESSARY 6 tablet 2  . ezetimibe-simvastatin (VYTORIN) 10-10 MG tablet TAKE 1 TABLET ONCE DAILY 90 tablet 2  . lisinopril (PRINIVIL,ZESTRIL) 10 MG tablet TAKE 1 TABLET DAILY 90 tablet 0  . sertraline (ZOLOFT) 50 MG tablet Take 1 tablet (50 mg total) by mouth daily. 90 tablet 3  . topiramate (TOPAMAX) 50 MG tablet TAKE 1 TABLET AT BEDTIME 90 tablet 2  . Menthol-Zinc Oxide (CALMOSEPTINE) 0.44-20.625 % OINT Apply 1 application topically 2 (two) times daily.    . promethazine (PHENERGAN) 25 MG tablet Take 25 mg by mouth every 6 (six) hours as needed for nausea.     No current facility-administered medications on file prior to visit.     No Known Allergies  Past Medical History:  Diagnosis Date  . Hemorrhoids   . Hyperlipidemia   . Hypertension     . Migraines     Past Surgical History:  Procedure Laterality Date  . HEMORRHOID BANDING      Family History  Problem Relation Age of Onset  . Diabetes Mother   . Hypertension Mother   . Hyperlipidemia Father   . [name] Parkinson White syndrome Brother   . Bone cancer Sister     Social History   Socioeconomic History  . Marital status: Married    Spouse name: Not on file  . Number of children: Not on file  . Years of education: Not on file  . Highest education level: Not on file  Social Needs  . Financial resource strain: Not on file  . Food insecurity - worry: Not on file  . Food insecurity - inability: Not on file  . Transportation needs - medical: Not on file  . Transportation needs - non-medical: Not on file  Occupational History  . Not on file  Tobacco Use  . Smoking status: Former Research scientist (life sciences)  . Smokeless tobacco: Never Used  Substance and Sexual Activity  . Alcohol use: Yes    Alcohol/week: 0.0 oz    Comment: couple drinks a week   . Drug use: No  . Sexual activity: Not on file  Other Topics Concern  . Not on file  Social History Narrative  . Not on file   The PMH, PSH, Social History, Family History, Medications, and allergies have been reviewed in [facility], and have been updated if relevant.   Review of Systems  Genitourinary: Positive for penile pain. Negative for decreased urine volume, difficulty urinating, discharge, dysuria, enuresis, flank pain, frequency, hematuria, penile swelling, scrotal swelling, testicular pain and urgency.  Skin: Positive for rash and wound.  All other systems reviewed and are negative.      Objective:    BP 126/88 (BP Location: Left Arm, Patient Position: Sitting, Cuff Size: Normal)   Pulse (!) 103   Temp 98.9 F (37.2 C) (Oral)   Ht 5\' 10"  (1.778 m)   Wt 210 lb (95.3 kg)   SpO2 96%   BMI 30.13 kg/m    Physical Exam  Constitutional: He is oriented to person, place, and time. He appears well-developed and  well-nourished. No distress.  HENT:  Head: Normocephalic and atraumatic.  Eyes: Conjunctivae are normal.  Cardiovascular: Normal rate.  Pulmonary/Chest: Effort normal.  Genitourinary:     Musculoskeletal: Normal range of motion.  Neurological: He is alert and oriented to person, place, and time. No cranial nerve deficit.  Skin: He is not diaphoretic.  Psychiatric: He has a normal mood and affect. His behavior is normal. Thought content normal.  Nursing note and vitals reviewed.         Assessment & Plan:   Penile lesion - Plan: HSV(herpes simplex vrs) 1+2 ab-IgG, CANCELED: HSV(herpes simplex vrs) 1+2 ab-IgM No Follow-up on file.

## 2017-07-20 NOTE — Patient Instructions (Signed)
Balanitis Balanitis is swelling and irritation (inflammation) of the head of the penis (glans penis). The condition may also cause inflammation of the skin around the glans penis (foreskin) in men who have not been circumcised. It may develop because of an infection or another medical condition. Balanitis occurs most often among men who have not had their foreskin removed (uncircumcised men). Balanitis sometimes causes scarring of the penis or foreskin, which can require surgery. Untreated balanitis can increase the risk of penile cancer. What are the causes? Common causes of this condition include:  Poor personal hygiene, especially in uncircumcised men. Not cleaning the glans penis and foreskin well can result in buildup of bacteria, viruses, and yeast, which can lead to infection and inflammation.  Irritation and lack of air flow due to fluid (smegma) that can build up on the glans penis.  Other causes include:  Chemical irritation from products such as soaps or shower gels (especially those that have fragrance), condoms, personal lubricants, petroleum jelly, spermicides, or fabric softeners.  Skin conditions, such as eczema, dermatitis, and psoriasis.  Allergies to medicines, such as tetracycline and sulfa drugs.  Certain medical conditions, including liver cirrhosis, congestive heart failure, diabetes, and kidney disease.  Infections, such as candidiasis, HPV (human papillomavirus), herpes simplex, gonorrhea, and syphilis.  Severe obesity.  What increases the risk? The following factors may make you more likely to develop this condition:  Having diabetes. This is the most common risk factor.  Having a tight foreskin that is difficult to pull back (retract) past the glans.  Having sexual intercourse without using a condom.  What are the signs or symptoms? Symptoms of this condition include:  Discharge from under the foreskin.  A bad smell.  Pain or difficulty retracting  the foreskin.  Tenderness, redness, and swelling of the glans.  A rash or sores on the glans or foreskin.  Itchiness.  Inability to get an erection due to pain.  Difficulty urinating.  Scarring of the penis or foreskin, in some cases.  How is this diagnosed? This condition may be diagnosed based on:  A physical exam.  Testing a swab of discharge to check for bacterial or fungal infection.  Blood tests: ? To check for viruses that can cause balanitis. ? To check your blood sugar (glucose) level. High blood glucose could be a sign of diabetes, which can cause balanitis.  How is this treated? Treatment for balanitis depends on the cause. Treatment may include:  Improving personal hygiene. Your health care provider may recommend sitting in a bath of warm water that is deep enough to cover your hips and buttocks (sitz bath).  Medicines such as: ? Creams or ointments to reduce swelling (steroids) or to treat an infection. ? Antibiotic medicine. ? Antifungal medicine.  Surgery to remove or cut the foreskin (circumcision). This may be done if you have scarring on the foreskin that makes it difficult to retract.  Controlling other medical problems that may be causing your condition or making it worse.  Follow these instructions at home:  Keep your penis clean and dry. Take sitz baths as recommended by your health care provider.  Avoid products that irritate your skin or make symptoms worse, such as soaps and shower gels that have fragrance.  Take over-the-counter and prescription medicines only as told by your health care provider. ? If you were prescribed an antibiotic medicine or a cream or ointment, use it as told by your health care provider. Do not stop using your medicine,  cream, or ointment even if you start to feel better. ? Do not drive or use heavy machinery while taking prescription pain medicine. Contact a health care provider if:  Your symptoms get worse or do  not improve with home care.  You develop chills or a fever.  You have trouble urinating.  You cannot retract your foreskin. Get help right away if:  You develop severe pain.  You are unable to urinate. Summary  Balanitis is inflammation of the head of the penis (glans penis) caused by irritation or infection.  Balanitis causes pain, redness, and swelling of the glans penis.  This condition is most common among uncircumcised men who do not keep their glans penis clean and in men who have diabetes.  Treatment may include creams or ointments.  Good hygiene is important for prevention. This includes pulling back the foreskin when washing your penis. This information is not intended to replace advice given to you by your health care provider. Make sure you discuss any questions you have with your health care provider. Document Released: 11/09/2008 Document Revised: 05/12/2016 Document Reviewed: 05/12/2016 Elsevier Interactive Patient Education  2017 [employer].

## 2017-07-20 NOTE — Assessment & Plan Note (Addendum)
New- consistent with balantiditis. Treat with clotrimazole 1% ointment twice daily x 7 days. See AVS for supportive care. Call or return to clinic prn if these symptoms worsen or fail to improve as anticipated. The patient indicates understanding of these issues and agrees with the plan.

## 2017-07-21 ENCOUNTER — Encounter: Payer: Self-pay | Admitting: Family Medicine

## 2017-07-21 DIAGNOSIS — J3081 Allergic rhinitis due to animal (cat) (dog) hair and dander: Secondary | ICD-10-CM | POA: Diagnosis not present

## 2017-07-21 DIAGNOSIS — J301 Allergic rhinitis due to pollen: Secondary | ICD-10-CM | POA: Diagnosis not present

## 2017-07-21 DIAGNOSIS — J3089 Other allergic rhinitis: Secondary | ICD-10-CM | POA: Diagnosis not present

## 2017-07-21 LAB — HSV(HERPES SIMPLEX VRS) I + II AB-IGG: HSV 2 IGG,TYPE SPECIFIC AB: <0.9 index

## 2017-07-29 DIAGNOSIS — J3081 Allergic rhinitis due to animal (cat) (dog) hair and dander: Secondary | ICD-10-CM | POA: Diagnosis not present

## 2017-07-29 DIAGNOSIS — J301 Allergic rhinitis due to pollen: Secondary | ICD-10-CM | POA: Diagnosis not present

## 2017-07-29 DIAGNOSIS — J3089 Other allergic rhinitis: Secondary | ICD-10-CM | POA: Diagnosis not present

## 2017-07-31 ENCOUNTER — Encounter: Payer: Self-pay | Admitting: Family Medicine

## 2017-07-31 ENCOUNTER — Ambulatory Visit: Payer: 59 | Admitting: Family Medicine

## 2017-07-31 VITALS — BP 138/90 | HR 97 | Temp 98.7°F | Ht 70.0 in | Wt 215.5 lb

## 2017-07-31 DIAGNOSIS — N481 Balanitis: Secondary | ICD-10-CM | POA: Diagnosis not present

## 2017-07-31 MED ORDER — KETOCONAZOLE 2 % EX CREA: 1.0000 "application " | TOPICAL_CREAM | Freq: Every day | CUTANEOUS | 0 refills | Status: DC

## 2017-07-31 NOTE — Progress Notes (Signed)
Subjective:  Patient ID: Micheal Keller, male    DOB: 1970/09/01  Age: 47 y.o. MRN: [id number]  CC: No chief complaint on file.   HPI Micheal Keller presents for follow up of a rash on his penis. Has tried clotrimazole cream with some success but wanders if it has caused some irritation in the affected area. uncircumsized and partner not affected.   Outpatient Medications Prior to Visit  Medication Sig Dispense Refill  . ALPRAZolam (XANAX) 0.25 MG tablet Take 1 tablet (0.25 mg total) by mouth 2 (two) times daily as needed for anxiety. 20 tablet 0  . cetirizine (ZYRTEC) 10 MG tablet Take 10 mg by mouth daily.    . Clotrimazole 1 % OINT Apply to area twice daily x 7 days 1 Tube 0  . eletriptan (RELPAX) 40 MG tablet TAKE 1 TABLET AS NEEDED FORMIGRAINE MAY REPEAT IN 2   HOURS IF NECESSARY 6 tablet 2  . ezetimibe-simvastatin (VYTORIN) 10-10 MG tablet TAKE 1 TABLET ONCE DAILY 90 tablet 2  . lisinopril (PRINIVIL,ZESTRIL) 10 MG tablet TAKE 1 TABLET DAILY 90 tablet 0  . Menthol-Zinc Oxide (CALMOSEPTINE) 0.44-20.625 % OINT Apply 1 application topically 2 (two) times daily.    . promethazine (PHENERGAN) 25 MG tablet Take 25 mg by mouth every 6 (six) hours as needed for nausea.    . sertraline (ZOLOFT) 50 MG tablet Take 1 tablet (50 mg total) by mouth daily. 90 tablet 3  . topiramate (TOPAMAX) 50 MG tablet TAKE 1 TABLET AT BEDTIME 90 tablet 2   No facility-administered medications prior to visit.     ROS Review of Systems  Constitutional: Negative.   Eyes: Negative.  Negative for pain and redness.  Gastrointestinal: Negative for abdominal pain.  Genitourinary: Negative for discharge, dysuria, hematuria, penile pain, penile swelling, scrotal swelling and testicular pain.  Musculoskeletal: Negative for arthralgias and myalgias.  Skin: Positive for rash.  Hematological: Does not bruise/bleed easily.  Psychiatric/Behavioral: Negative.     Objective:  BP 138/90 (BP Location: Left Arm, Patient Position:  Sitting, Cuff Size: Normal)   Pulse 97   Temp 98.7 F (37.1 C) (Oral)   Ht 5\' 10"  (1.778 m)   Wt 215 lb 8 oz (97.8 kg)   SpO2 97%   BMI 30.92 kg/m   BP Readings from Last 3 Encounters:  07/31/17 138/90  07/20/17 126/88  07/10/17 134/68    Wt Readings from Last 3 Encounters:  07/31/17 215 lb 8 oz (97.8 kg)  07/20/17 210 lb (95.3 kg)  07/10/17 213 lb (96.6 kg)    Physical Exam  Constitutional: He is oriented to person, place, and time. He appears well-developed and well-nourished. No distress.  HENT:  Head: Normocephalic and atraumatic.  Right Ear: External ear normal.  Left Ear: External ear normal.  Eyes: Right eye exhibits no discharge. Left eye exhibits no discharge. No scleral icterus.  Neck: No JVD present. No tracheal deviation present.  Pulmonary/Chest: Effort normal. No stridor.  Genitourinary: Penis normal.    Right testis is descended. Left testis is descended. Uncircumcised. No penile tenderness. No discharge found.  Lymphadenopathy:       Right: No inguinal adenopathy present.       Left: No inguinal adenopathy present.  Neurological: He is alert and oriented to person, place, and time.  Skin: Skin is warm and dry. He is not diaphoretic.  Psychiatric: He has a normal mood and affect. His behavior is normal.    Lab Results  Component Value Date  WBC 4.9 09/20/2012   HGB 13.4 09/20/2012   HCT 40.2 09/20/2012   PLT 260.0 09/20/2012   GLUCOSE 109 (H) 11/13/2016   CHOL 186 11/13/2016   TRIG 84.0 11/13/2016   HDL 41.60 11/13/2016   LDLCALC 128 (H) 11/13/2016   ALT 29 11/13/2016   AST 26 11/13/2016   NA 139 11/13/2016   K 3.7 11/13/2016   CL 106 11/13/2016   CREATININE 1.05 11/13/2016   BUN 12 11/13/2016   CO2 27 11/13/2016   PSA 0.82 11/13/2016    Patient was never admitted.  Assessment & Plan:   Diagnoses and all orders for this visit:  Balanitis -     ketoconazole (NIZORAL) 2 % cream; Apply 1 application topically daily.   I am having  Zinedine Jenne Campus start on ketoconazole. I am also having him maintain his promethazine, Menthol-Zinc Oxide, cetirizine, ALPRAZolam, sertraline, eletriptan, topiramate, ezetimibe-simvastatin, lisinopril, and Clotrimazole.  Meds ordered this encounter  Medications  . ketoconazole (NIZORAL) 2 % cream    Sig: Apply 1 application topically daily.    Dispense:  15 g    Refill:  0   He can discontinue the clotrimazole try the Nizoral for another week.  It looks as though as though the area is healing nicely.  Follow-up: Return in about 1 week (around 08/07/2017), or if symptoms worsen or fail to improve.  Libby Maw, MD

## 2017-08-05 DIAGNOSIS — J3081 Allergic rhinitis due to animal (cat) (dog) hair and dander: Secondary | ICD-10-CM | POA: Diagnosis not present

## 2017-08-05 DIAGNOSIS — J301 Allergic rhinitis due to pollen: Secondary | ICD-10-CM | POA: Diagnosis not present

## 2017-08-05 DIAGNOSIS — J3089 Other allergic rhinitis: Secondary | ICD-10-CM | POA: Diagnosis not present

## 2017-08-06 DIAGNOSIS — J3081 Allergic rhinitis due to animal (cat) (dog) hair and dander: Secondary | ICD-10-CM | POA: Diagnosis not present

## 2017-08-06 DIAGNOSIS — J301 Allergic rhinitis due to pollen: Secondary | ICD-10-CM | POA: Diagnosis not present

## 2017-08-07 DIAGNOSIS — J3089 Other allergic rhinitis: Secondary | ICD-10-CM | POA: Diagnosis not present

## 2017-08-08 ENCOUNTER — Other Ambulatory Visit: Payer: Self-pay | Admitting: Family Medicine

## 2017-08-12 DIAGNOSIS — J3081 Allergic rhinitis due to animal (cat) (dog) hair and dander: Secondary | ICD-10-CM | POA: Diagnosis not present

## 2017-08-12 DIAGNOSIS — J301 Allergic rhinitis due to pollen: Secondary | ICD-10-CM | POA: Diagnosis not present

## 2017-08-12 DIAGNOSIS — J3089 Other allergic rhinitis: Secondary | ICD-10-CM | POA: Diagnosis not present

## 2017-08-18 DIAGNOSIS — J3081 Allergic rhinitis due to animal (cat) (dog) hair and dander: Secondary | ICD-10-CM | POA: Diagnosis not present

## 2017-08-18 DIAGNOSIS — J3089 Other allergic rhinitis: Secondary | ICD-10-CM | POA: Diagnosis not present

## 2017-08-18 DIAGNOSIS — J301 Allergic rhinitis due to pollen: Secondary | ICD-10-CM | POA: Diagnosis not present

## 2017-08-28 DIAGNOSIS — J301 Allergic rhinitis due to pollen: Secondary | ICD-10-CM | POA: Diagnosis not present

## 2017-08-28 DIAGNOSIS — J3081 Allergic rhinitis due to animal (cat) (dog) hair and dander: Secondary | ICD-10-CM | POA: Diagnosis not present

## 2017-08-28 DIAGNOSIS — J3089 Other allergic rhinitis: Secondary | ICD-10-CM | POA: Diagnosis not present

## 2017-09-05 ENCOUNTER — Other Ambulatory Visit: Payer: Self-pay | Admitting: Family Medicine

## 2017-09-05 DIAGNOSIS — F411 Generalized anxiety disorder: Secondary | ICD-10-CM

## 2017-09-11 DIAGNOSIS — J3089 Other allergic rhinitis: Secondary | ICD-10-CM | POA: Diagnosis not present

## 2017-09-11 DIAGNOSIS — J301 Allergic rhinitis due to pollen: Secondary | ICD-10-CM | POA: Diagnosis not present

## 2017-09-11 DIAGNOSIS — J3081 Allergic rhinitis due to animal (cat) (dog) hair and dander: Secondary | ICD-10-CM | POA: Diagnosis not present

## 2017-09-14 ENCOUNTER — Ambulatory Visit: Payer: 59 | Admitting: Nurse Practitioner

## 2017-09-14 ENCOUNTER — Ambulatory Visit (INDEPENDENT_AMBULATORY_CARE_PROVIDER_SITE_OTHER): Payer: 59

## 2017-09-14 ENCOUNTER — Encounter: Payer: Self-pay | Admitting: Nurse Practitioner

## 2017-09-14 VITALS — BP 132/86 | HR 95 | Temp 99.1°F | Ht 70.0 in | Wt 213.6 lb

## 2017-09-14 DIAGNOSIS — R059 Cough, unspecified: Secondary | ICD-10-CM

## 2017-09-14 DIAGNOSIS — R0602 Shortness of breath: Secondary | ICD-10-CM | POA: Diagnosis not present

## 2017-09-14 DIAGNOSIS — R05 Cough: Secondary | ICD-10-CM

## 2017-09-14 DIAGNOSIS — J189 Pneumonia, unspecified organism: Secondary | ICD-10-CM

## 2017-09-14 DIAGNOSIS — J0140 Acute pansinusitis, unspecified: Secondary | ICD-10-CM

## 2017-09-14 MED ORDER — ALBUTEROL SULFATE HFA 108 (90 BASE) MCG/ACT IN AERS: 1.0000 | INHALATION_SPRAY | Freq: Four times a day (QID) | RESPIRATORY_TRACT | 0 refills | Status: DC | PRN

## 2017-09-14 MED ORDER — AZITHROMYCIN 250 MG PO TABS: 250.0000 mg | ORAL_TABLET | Freq: Every day | ORAL | 0 refills | Status: DC

## 2017-09-14 MED ORDER — DM-GUAIFENESIN ER 30-600 MG PO TB12: 1.0000 | ORAL_TABLET | Freq: Two times a day (BID) | ORAL | 0 refills | Status: DC | PRN

## 2017-09-14 MED ORDER — SALINE SPRAY 0.65 % NA SOLN: 1.0000 | NASAL | 0 refills | Status: DC | PRN

## 2017-09-14 MED ORDER — BENZONATATE 100 MG PO CAPS: 100.0000 mg | ORAL_CAPSULE | Freq: Three times a day (TID) | ORAL | 0 refills | Status: DC | PRN

## 2017-09-14 NOTE — Progress Notes (Signed)
Subjective:  Patient ID: Micheal Keller, male    DOB: March 25, 1971  Age: 47 y.o. MRN: [id number]  CC: Cough (cough lasting two weeks. Sinus pressure. Dark yellow mucous with cough. Took OTC saw Dr Robb Matar. Just returned from [place].)   Cough  This is a new problem. The current episode started 1 to 4 weeks ago. The problem has been waxing and waning. The cough is productive of purulent sputum. Associated symptoms include chills, a fever, headaches, nasal congestion, postnasal drip, rhinorrhea, shortness of breath and wheezing. Pertinent negatives include no chest pain, heartburn, hemoptysis, myalgias, sore throat, sweats or weight loss. Risk factors for lung disease include travel. He has tried OTC cough suppressant and steroid inhaler for the symptoms. The treatment provided mild relief. His past medical history is significant for environmental allergies.  returned from [place] 1week ago. No edema, no PND or orthopnea  Outpatient Medications Prior to Visit  Medication Sig Dispense Refill  . ALPRAZolam (XANAX) 0.25 MG tablet Take 1 tablet (0.25 mg total) by mouth 2 (two) times daily as needed for anxiety. 20 tablet 0  . cetirizine (ZYRTEC) 10 MG tablet Take 10 mg by mouth daily.    . Clotrimazole 1 % OINT Apply to area twice daily x 7 days 1 Tube 0  . eletriptan (RELPAX) 40 MG tablet TAKE 1 TABLET AS NEEDED FORMIGRAINE MAY REPEAT IN 2   HOURS IF NECESSARY 6 tablet 2  . ezetimibe-simvastatin (VYTORIN) 10-10 MG tablet TAKE 1 TABLET ONCE DAILY 90 tablet 2  . ketoconazole (NIZORAL) 2 % cream Apply 1 application topically daily. 15 g 0  . lisinopril (PRINIVIL,ZESTRIL) 10 MG tablet TAKE 1 TABLET DAILY 90 tablet 0  . lisinopril (PRINIVIL,ZESTRIL) 10 MG tablet Take 1 tablet (10 mg total) by mouth daily. 90 tablet 1  . Menthol-Zinc Oxide (CALMOSEPTINE) 0.44-20.625 % OINT Apply 1 application topically 2 (two) times daily.    . promethazine (PHENERGAN) 25 MG tablet Take 25 mg by mouth every 6 (six) hours as  needed for nausea.    . sertraline (ZOLOFT) 50 MG tablet Take 1 tablet (50 mg total) by mouth daily. 90 tablet 3  . sertraline (ZOLOFT) 50 MG tablet TAKE 1 TABLET DAILY 90 tablet 1  . topiramate (TOPAMAX) 50 MG tablet TAKE 1 TABLET AT BEDTIME 90 tablet 2   No facility-administered medications prior to visit.     ROS See HPI  Objective:  BP 132/86 (BP Location: Left Arm)   Pulse 95   Temp 99.1 F (37.3 C)   Ht 5\' 10"  (1.778 m)   Wt 213 lb 9.6 oz (96.9 kg)   SpO2 96%   BMI 30.65 kg/m   BP Readings from Last 3 Encounters:  09/14/17 132/86  07/31/17 138/90  07/20/17 126/88    Wt Readings from Last 3 Encounters:  09/14/17 213 lb 9.6 oz (96.9 kg)  07/31/17 215 lb 8 oz (97.8 kg)  07/20/17 210 lb (95.3 kg)    Physical Exam  Constitutional: He is oriented to person, place, and time. No distress.  HENT:  Right Ear: Tympanic membrane, external ear and ear canal normal.  Left Ear: Tympanic membrane, external ear and ear canal normal.  Nose: Mucosal edema and rhinorrhea present. Right sinus exhibits maxillary sinus tenderness. Right sinus exhibits no frontal sinus tenderness. Left sinus exhibits maxillary sinus tenderness. Left sinus exhibits no frontal sinus tenderness.  Mouth/Throat: Posterior oropharyngeal erythema present. No oropharyngeal exudate.  Cardiovascular: Normal rate and regular rhythm.  Pulmonary/Chest: Effort normal and  breath sounds normal. No respiratory distress. He has no wheezes. He has no rales.  Musculoskeletal: He exhibits no edema.  Neurological: He is alert and oriented to person, place, and time.  Vitals reviewed.   Lab Results  Component Value Date   WBC 4.9 09/20/2012   HGB 13.4 09/20/2012   HCT 40.2 09/20/2012   PLT 260.0 09/20/2012   GLUCOSE 109 (H) 11/13/2016   CHOL 186 11/13/2016   TRIG 84.0 11/13/2016   HDL 41.60 11/13/2016   LDLCALC 128 (H) 11/13/2016   ALT 29 11/13/2016   AST 26 11/13/2016   NA 139 11/13/2016   K 3.7 11/13/2016    CL 106 11/13/2016   CREATININE 1.05 11/13/2016   BUN 12 11/13/2016   CO2 27 11/13/2016   PSA 0.82 11/13/2016    Patient was never admitted.  Assessment & Plan:   Sparsh was seen today for cough.  Diagnoses and all orders for this visit:  Lingular pneumonia -     DG Chest 2 View; Future -     sodium chloride (OCEAN) 0.65 % SOLN nasal spray; Place 1 spray into both nostrils as needed for congestion. -     dextromethorphan-guaiFENesin (MUCINEX DM) 30-600 MG 12hr tablet; Take 1 tablet by mouth 2 (two) times daily as needed for cough. -     benzonatate (TESSALON) 100 MG capsule; Take 1 capsule (100 mg total) by mouth 3 (three) times daily as needed for cough. -     albuterol (PROVENTIL HFA;VENTOLIN HFA) 108 (90 Base) MCG/ACT inhaler; Inhale 1-2 puffs into the lungs every 6 (six) hours as needed. -     azithromycin (ZITHROMAX Z-PAK) 250 MG tablet; Take 1 tablet (250 mg total) by mouth daily. Take 2tabs on first day, then 1tab once a day till complete  SOB (shortness of breath) on exertion -     DG Chest 2 View; Future -     sodium chloride (OCEAN) 0.65 % SOLN nasal spray; Place 1 spray into both nostrils as needed for congestion. -     dextromethorphan-guaiFENesin (MUCINEX DM) 30-600 MG 12hr tablet; Take 1 tablet by mouth 2 (two) times daily as needed for cough. -     benzonatate (TESSALON) 100 MG capsule; Take 1 capsule (100 mg total) by mouth 3 (three) times daily as needed for cough. -     albuterol (PROVENTIL HFA;VENTOLIN HFA) 108 (90 Base) MCG/ACT inhaler; Inhale 1-2 puffs into the lungs every 6 (six) hours as needed. -     azithromycin (ZITHROMAX Z-PAK) 250 MG tablet; Take 1 tablet (250 mg total) by mouth daily. Take 2tabs on first day, then 1tab once a day till complete  Acute non-recurrent pansinusitis -     azithromycin (ZITHROMAX Z-PAK) 250 MG tablet; Take 1 tablet (250 mg total) by mouth daily. Take 2tabs on first day, then 1tab once a day till complete   I am having Jaesean Finelli  start on sodium chloride, dextromethorphan-guaiFENesin, benzonatate, albuterol, and azithromycin. I am also having him maintain his promethazine, Menthol-Zinc Oxide, cetirizine, ALPRAZolam, sertraline, eletriptan, topiramate, ezetimibe-simvastatin, lisinopril, Clotrimazole, ketoconazole, lisinopril, and sertraline.  Meds ordered this encounter  Medications  . sodium chloride (OCEAN) 0.65 % SOLN nasal spray    Sig: Place 1 spray into both nostrils as needed for congestion.    Dispense:  15 mL    Refill:  0    Order Specific Question:   Supervising Provider    Answer:   Lucille Passy [[id number]]  . dextromethorphan-guaiFENesin [facility]  DM) 30-600 MG 12hr tablet    Sig: Take 1 tablet by mouth 2 (two) times daily as needed for cough.    Dispense:  14 tablet    Refill:  0    Order Specific Question:   Supervising Provider    Answer:   Lucille Passy [[id number]]  . benzonatate (TESSALON) 100 MG capsule    Sig: Take 1 capsule (100 mg total) by mouth 3 (three) times daily as needed for cough.    Dispense:  20 capsule    Refill:  0    Order Specific Question:   Supervising Provider    Answer:   Lucille Passy [[id number]]  . albuterol (PROVENTIL HFA;VENTOLIN HFA) 108 (90 Base) MCG/ACT inhaler    Sig: Inhale 1-2 puffs into the lungs every 6 (six) hours as needed.    Dispense:  1 Inhaler    Refill:  0    Order Specific Question:   Supervising Provider    Answer:   Lucille Passy [[id number]]  . azithromycin (ZITHROMAX Z-PAK) 250 MG tablet    Sig: Take 1 tablet (250 mg total) by mouth daily. Take 2tabs on first day, then 1tab once a day till complete    Dispense:  6 tablet    Refill:  0    Order Specific Question:   Supervising Provider    Answer:   Lucille Passy [[id number]]    Follow-up: No Follow-up on file.  Wilfred Lacy, NP

## 2017-09-14 NOTE — Patient Instructions (Addendum)
CXR indicates pneumonia. Oral abx sent. F/up in 3-4weeks with pcp for re eval of pneumonia.  Push oral hydration.

## 2017-09-16 ENCOUNTER — Encounter: Payer: Self-pay | Admitting: Family Medicine

## 2017-09-25 DIAGNOSIS — J301 Allergic rhinitis due to pollen: Secondary | ICD-10-CM | POA: Diagnosis not present

## 2017-09-25 DIAGNOSIS — J3089 Other allergic rhinitis: Secondary | ICD-10-CM | POA: Diagnosis not present

## 2017-09-25 DIAGNOSIS — J3081 Allergic rhinitis due to animal (cat) (dog) hair and dander: Secondary | ICD-10-CM | POA: Diagnosis not present

## 2017-09-30 DIAGNOSIS — J3089 Other allergic rhinitis: Secondary | ICD-10-CM | POA: Diagnosis not present

## 2017-09-30 DIAGNOSIS — J301 Allergic rhinitis due to pollen: Secondary | ICD-10-CM | POA: Diagnosis not present

## 2017-09-30 DIAGNOSIS — J3081 Allergic rhinitis due to animal (cat) (dog) hair and dander: Secondary | ICD-10-CM | POA: Diagnosis not present

## 2017-10-05 ENCOUNTER — Encounter: Payer: Self-pay | Admitting: Internal Medicine

## 2017-10-05 ENCOUNTER — Ambulatory Visit: Payer: 59 | Admitting: Family Medicine

## 2017-10-07 ENCOUNTER — Encounter: Payer: Self-pay | Admitting: Family Medicine

## 2017-10-07 ENCOUNTER — Ambulatory Visit (INDEPENDENT_AMBULATORY_CARE_PROVIDER_SITE_OTHER): Payer: 59

## 2017-10-07 ENCOUNTER — Ambulatory Visit: Payer: 59 | Admitting: Family Medicine

## 2017-10-07 VITALS — BP 120/86 | HR 63 | Temp 98.8°F | Ht 70.0 in | Wt 212.2 lb

## 2017-10-07 DIAGNOSIS — J189 Pneumonia, unspecified organism: Secondary | ICD-10-CM | POA: Diagnosis not present

## 2017-10-07 MED ORDER — LEVOFLOXACIN 500 MG PO TABS: 500.0000 mg | ORAL_TABLET | Freq: Every day | ORAL | 0 refills | Status: DC

## 2017-10-07 NOTE — Patient Instructions (Signed)
Great to see you.  Take levaquin as directed- 1 tablet by mouth daily.

## 2017-10-07 NOTE — Assessment & Plan Note (Signed)
Lung exam reassuring but clinically concerning for lack of resolution of PNA. Repeat CXR today. Levaquin daily x 7 days. The patient indicates understanding of these issues and agrees with the plan.

## 2017-10-07 NOTE — Progress Notes (Signed)
Subjective:   Patient ID: Micheal Keller, male    DOB: 08/12/1970, 47 y.o.   MRN: [id number]  Micheal Keller is a pleasant 47 y.o. year old male who presents to clinic today with Cough (Patient is here today to follow-up with cough.  On 3.11.19 patient was seen by CN and Dx with Lingular Pneumonia.  He was Tx with Z-Pak, Albuterol, Tessalon Perles, and Mucinex.  He states that he had started feeling better but now it feels like it is coming back.  The cough won't go away and is very rarely productive.  When he lays down at night he can hear it in his lungs and has night sweats.)  on 10/07/2017  HPI:  Follow up PNA-   Was seen by Baldo Ash on 09/14/17, diagnosed with Lingular PNA.  CXR reviewed.  Dg Chest 2 View  Result Date: 09/14/2017 CLINICAL DATA:  Cough, shortness of Breath EXAM: CHEST - 2 VIEW COMPARISON:  None. FINDINGS: Consolidation within the lingula compatible with pneumonia. Right lung is clear. Heart is normal size. No effusions or acute bony abnormality. IMPRESSION: Lingular pneumonia. Electronically Signed   By: Rolm Baptise M.D.   On: 09/14/2017 10:19     Treated with Zpack, albuterol, Tessalon Perles, and Mucinex.  Felt a little better initially but over the past couple of weeks, feeling the way he did when he initially presented to see [facility]- chest feels tight, coughing, having night sweats. No fever.   Current Outpatient Medications on File Prior to Visit  Medication Sig Dispense Refill  . albuterol (PROVENTIL HFA;VENTOLIN HFA) 108 (90 Base) MCG/ACT inhaler Inhale 1-2 puffs into the lungs every 6 (six) hours as needed. 1 Inhaler 0  . ALPRAZolam (XANAX) 0.25 MG tablet Take 1 tablet (0.25 mg total) by mouth 2 (two) times daily as needed for anxiety. 20 tablet 0  . cetirizine (ZYRTEC) 10 MG tablet Take 10 mg by mouth daily.    . Clotrimazole 1 % OINT Apply to area twice daily x 7 days 1 Tube 0  . dextromethorphan-guaiFENesin (MUCINEX DM) 30-600 MG 12hr tablet Take 1 tablet by  mouth 2 (two) times daily as needed for cough. 14 tablet 0  . DYMISTA 137-50 MCG/ACT SUSP     . eletriptan (RELPAX) 40 MG tablet TAKE 1 TABLET AS NEEDED FORMIGRAINE MAY REPEAT IN 2   HOURS IF NECESSARY 6 tablet 2  . ezetimibe-simvastatin (VYTORIN) 10-10 MG tablet TAKE 1 TABLET ONCE DAILY 90 tablet 2  . ketoconazole (NIZORAL) 2 % cream Apply 1 application topically daily. 15 g 0  . lisinopril (PRINIVIL,ZESTRIL) 10 MG tablet Take 1 tablet (10 mg total) by mouth daily. 90 tablet 1  . Menthol-Zinc Oxide (CALMOSEPTINE) 0.44-20.625 % OINT Apply 1 application topically 2 (two) times daily.    . promethazine (PHENERGAN) 25 MG tablet Take 25 mg by mouth every 6 (six) hours as needed for nausea.    . sertraline (ZOLOFT) 50 MG tablet Take 1 tablet (50 mg total) by mouth daily. 90 tablet 3  . sertraline (ZOLOFT) 50 MG tablet TAKE 1 TABLET DAILY 90 tablet 1  . sodium chloride (OCEAN) 0.65 % SOLN nasal spray Place 1 spray into both nostrils as needed for congestion. 15 mL 0  . topiramate (TOPAMAX) 50 MG tablet TAKE 1 TABLET AT BEDTIME 90 tablet 2   No current facility-administered medications on file prior to visit.     No Known Allergies  Past Medical History:  Diagnosis Date  . Hemorrhoids   .  Hyperlipidemia   . Hypertension   . Migraines     Past Surgical History:  Procedure Laterality Date  . HEMORRHOID BANDING      Family History  Problem Relation Age of Onset  . Diabetes Mother   . Hypertension Mother   . Hyperlipidemia Father   . [name] Parkinson White syndrome Brother   . Bone cancer Sister     Social History   Socioeconomic History  . Marital status: Married    Spouse name: Not on file  . Number of children: Not on file  . Years of education: Not on file  . Highest education level: Not on file  Occupational History  . Not on file  Social Needs  . Financial resource strain: Not on file  . Food insecurity:    Worry: Not on file    Inability: Not on file  .  Transportation needs:    Medical: Not on file    Non-medical: Not on file  Tobacco Use  . Smoking status: Former Research scientist (life sciences)  . Smokeless tobacco: Never Used  Substance and Sexual Activity  . Alcohol use: Yes    Alcohol/week: 0.0 oz    Comment: couple drinks a week   . Drug use: No  . Sexual activity: Not on file  Lifestyle  . Physical activity:    Days per week: Not on file    Minutes per session: Not on file  . Stress: Not on file  Relationships  . Social connections:    Talks on phone: Not on file    Gets together: Not on file    Attends religious service: Not on file    Active member of club or organization: Not on file    Attends meetings of clubs or organizations: Not on file    Relationship status: Not on file  . Intimate partner violence:    Fear of current or ex partner: Not on file    Emotionally abused: Not on file    Physically abused: Not on file    Forced sexual activity: Not on file  Other Topics Concern  . Not on file  Social History Narrative  . Not on file   The PMH, PSH, Social History, Family History, Medications, and allergies have been reviewed in [facility], and have been updated if relevant.   Review of Systems  Constitutional: Positive for fatigue. Negative for fever.  Respiratory: Positive for cough, shortness of breath and wheezing. Negative for stridor.   Cardiovascular: Negative.   Gastrointestinal: Negative.   Musculoskeletal: Negative.   Neurological: Negative.   Hematological: Negative.   Psychiatric/Behavioral: Negative.   All other systems reviewed and are negative.      Objective:    BP 120/86 (BP Location: Left Arm, Patient Position: Sitting, Cuff Size: Normal)   Pulse 63   Temp 98.8 F (37.1 C) (Oral)   Ht 5\' 10"  (1.778 m)   Wt 212 lb 3.2 oz (96.3 kg)   SpO2 96%   BMI 30.45 kg/m    Physical Exam  Constitutional: He is oriented to person, place, and time. He appears well-developed and well-nourished. No distress.  HENT:  Head:  Normocephalic and atraumatic.  Eyes: Conjunctivae are normal.  Cardiovascular: Normal rate and regular rhythm.  Pulmonary/Chest: Effort normal and breath sounds normal.  Musculoskeletal: Normal range of motion. He exhibits no edema.  Neurological: He is alert and oriented to person, place, and time. No cranial nerve deficit.  Skin: Skin is warm and dry. He is not  diaphoretic.  Psychiatric: He has a normal mood and affect. His behavior is normal. Judgment and thought content normal.  Nursing note and vitals reviewed.         Assessment & Plan:   No diagnosis found. No follow-ups on file.

## 2017-10-14 DIAGNOSIS — J301 Allergic rhinitis due to pollen: Secondary | ICD-10-CM | POA: Diagnosis not present

## 2017-10-14 DIAGNOSIS — J3081 Allergic rhinitis due to animal (cat) (dog) hair and dander: Secondary | ICD-10-CM | POA: Diagnosis not present

## 2017-10-14 DIAGNOSIS — J3089 Other allergic rhinitis: Secondary | ICD-10-CM | POA: Diagnosis not present

## 2017-10-22 DIAGNOSIS — J3089 Other allergic rhinitis: Secondary | ICD-10-CM | POA: Diagnosis not present

## 2017-10-22 DIAGNOSIS — J3081 Allergic rhinitis due to animal (cat) (dog) hair and dander: Secondary | ICD-10-CM | POA: Diagnosis not present

## 2017-10-22 DIAGNOSIS — J301 Allergic rhinitis due to pollen: Secondary | ICD-10-CM | POA: Diagnosis not present

## 2017-10-28 DIAGNOSIS — J3081 Allergic rhinitis due to animal (cat) (dog) hair and dander: Secondary | ICD-10-CM | POA: Diagnosis not present

## 2017-10-28 DIAGNOSIS — J3089 Other allergic rhinitis: Secondary | ICD-10-CM | POA: Diagnosis not present

## 2017-10-28 DIAGNOSIS — J301 Allergic rhinitis due to pollen: Secondary | ICD-10-CM | POA: Diagnosis not present

## 2017-11-05 DIAGNOSIS — J3089 Other allergic rhinitis: Secondary | ICD-10-CM | POA: Diagnosis not present

## 2017-11-05 DIAGNOSIS — J301 Allergic rhinitis due to pollen: Secondary | ICD-10-CM | POA: Diagnosis not present

## 2017-11-05 DIAGNOSIS — J3081 Allergic rhinitis due to animal (cat) (dog) hair and dander: Secondary | ICD-10-CM | POA: Diagnosis not present

## 2017-11-10 DIAGNOSIS — J3081 Allergic rhinitis due to animal (cat) (dog) hair and dander: Secondary | ICD-10-CM | POA: Diagnosis not present

## 2017-11-10 DIAGNOSIS — J3089 Other allergic rhinitis: Secondary | ICD-10-CM | POA: Diagnosis not present

## 2017-11-10 DIAGNOSIS — J301 Allergic rhinitis due to pollen: Secondary | ICD-10-CM | POA: Diagnosis not present

## 2017-11-12 DIAGNOSIS — J3081 Allergic rhinitis due to animal (cat) (dog) hair and dander: Secondary | ICD-10-CM | POA: Diagnosis not present

## 2017-11-12 DIAGNOSIS — J301 Allergic rhinitis due to pollen: Secondary | ICD-10-CM | POA: Diagnosis not present

## 2017-11-12 DIAGNOSIS — J3089 Other allergic rhinitis: Secondary | ICD-10-CM | POA: Diagnosis not present

## 2017-11-26 DIAGNOSIS — J301 Allergic rhinitis due to pollen: Secondary | ICD-10-CM | POA: Diagnosis not present

## 2017-11-26 DIAGNOSIS — J3089 Other allergic rhinitis: Secondary | ICD-10-CM | POA: Diagnosis not present

## 2017-11-26 DIAGNOSIS — J3081 Allergic rhinitis due to animal (cat) (dog) hair and dander: Secondary | ICD-10-CM | POA: Diagnosis not present

## 2017-12-08 DIAGNOSIS — J3081 Allergic rhinitis due to animal (cat) (dog) hair and dander: Secondary | ICD-10-CM | POA: Diagnosis not present

## 2017-12-08 DIAGNOSIS — J3089 Other allergic rhinitis: Secondary | ICD-10-CM | POA: Diagnosis not present

## 2017-12-08 DIAGNOSIS — J301 Allergic rhinitis due to pollen: Secondary | ICD-10-CM | POA: Diagnosis not present

## 2017-12-18 DIAGNOSIS — J3089 Other allergic rhinitis: Secondary | ICD-10-CM | POA: Diagnosis not present

## 2017-12-18 DIAGNOSIS — J301 Allergic rhinitis due to pollen: Secondary | ICD-10-CM | POA: Diagnosis not present

## 2017-12-18 DIAGNOSIS — J3081 Allergic rhinitis due to animal (cat) (dog) hair and dander: Secondary | ICD-10-CM | POA: Diagnosis not present

## 2017-12-28 DIAGNOSIS — J3081 Allergic rhinitis due to animal (cat) (dog) hair and dander: Secondary | ICD-10-CM | POA: Diagnosis not present

## 2017-12-28 DIAGNOSIS — J301 Allergic rhinitis due to pollen: Secondary | ICD-10-CM | POA: Diagnosis not present

## 2017-12-28 DIAGNOSIS — J3089 Other allergic rhinitis: Secondary | ICD-10-CM | POA: Diagnosis not present

## 2018-01-09 ENCOUNTER — Other Ambulatory Visit: Payer: Self-pay | Admitting: Family Medicine

## 2018-01-12 DIAGNOSIS — J3089 Other allergic rhinitis: Secondary | ICD-10-CM | POA: Diagnosis not present

## 2018-01-12 DIAGNOSIS — J301 Allergic rhinitis due to pollen: Secondary | ICD-10-CM | POA: Diagnosis not present

## 2018-01-12 DIAGNOSIS — J3081 Allergic rhinitis due to animal (cat) (dog) hair and dander: Secondary | ICD-10-CM | POA: Diagnosis not present

## 2018-01-26 DIAGNOSIS — J3089 Other allergic rhinitis: Secondary | ICD-10-CM | POA: Diagnosis not present

## 2018-01-26 DIAGNOSIS — J3081 Allergic rhinitis due to animal (cat) (dog) hair and dander: Secondary | ICD-10-CM | POA: Diagnosis not present

## 2018-01-26 DIAGNOSIS — J301 Allergic rhinitis due to pollen: Secondary | ICD-10-CM | POA: Diagnosis not present

## 2018-02-12 DIAGNOSIS — J301 Allergic rhinitis due to pollen: Secondary | ICD-10-CM | POA: Diagnosis not present

## 2018-02-12 DIAGNOSIS — J3089 Other allergic rhinitis: Secondary | ICD-10-CM | POA: Diagnosis not present

## 2018-02-12 DIAGNOSIS — J3081 Allergic rhinitis due to animal (cat) (dog) hair and dander: Secondary | ICD-10-CM | POA: Diagnosis not present

## 2018-02-18 DIAGNOSIS — J301 Allergic rhinitis due to pollen: Secondary | ICD-10-CM | POA: Diagnosis not present

## 2018-02-18 DIAGNOSIS — J3081 Allergic rhinitis due to animal (cat) (dog) hair and dander: Secondary | ICD-10-CM | POA: Diagnosis not present

## 2018-02-18 DIAGNOSIS — J3089 Other allergic rhinitis: Secondary | ICD-10-CM | POA: Diagnosis not present

## 2018-02-19 ENCOUNTER — Other Ambulatory Visit: Payer: Self-pay | Admitting: Family Medicine

## 2018-02-19 DIAGNOSIS — F411 Generalized anxiety disorder: Secondary | ICD-10-CM

## 2018-02-24 DIAGNOSIS — J3089 Other allergic rhinitis: Secondary | ICD-10-CM | POA: Diagnosis not present

## 2018-02-24 DIAGNOSIS — J3081 Allergic rhinitis due to animal (cat) (dog) hair and dander: Secondary | ICD-10-CM | POA: Diagnosis not present

## 2018-02-24 DIAGNOSIS — J301 Allergic rhinitis due to pollen: Secondary | ICD-10-CM | POA: Diagnosis not present

## 2018-03-05 DIAGNOSIS — J3089 Other allergic rhinitis: Secondary | ICD-10-CM | POA: Diagnosis not present

## 2018-03-05 DIAGNOSIS — J301 Allergic rhinitis due to pollen: Secondary | ICD-10-CM | POA: Diagnosis not present

## 2018-03-05 DIAGNOSIS — J3081 Allergic rhinitis due to animal (cat) (dog) hair and dander: Secondary | ICD-10-CM | POA: Diagnosis not present

## 2018-03-19 ENCOUNTER — Other Ambulatory Visit: Payer: Self-pay | Admitting: Family Medicine

## 2018-03-19 DIAGNOSIS — J3081 Allergic rhinitis due to animal (cat) (dog) hair and dander: Secondary | ICD-10-CM | POA: Diagnosis not present

## 2018-03-19 DIAGNOSIS — J3089 Other allergic rhinitis: Secondary | ICD-10-CM | POA: Diagnosis not present

## 2018-03-19 DIAGNOSIS — J301 Allergic rhinitis due to pollen: Secondary | ICD-10-CM | POA: Diagnosis not present

## 2018-03-22 ENCOUNTER — Other Ambulatory Visit: Payer: Self-pay

## 2018-03-22 MED ORDER — LISINOPRIL 10 MG PO TABS: 10.0000 mg | ORAL_TABLET | Freq: Every day | ORAL | 1 refills | Status: DC

## 2018-04-02 DIAGNOSIS — J3081 Allergic rhinitis due to animal (cat) (dog) hair and dander: Secondary | ICD-10-CM | POA: Diagnosis not present

## 2018-04-02 DIAGNOSIS — J3089 Other allergic rhinitis: Secondary | ICD-10-CM | POA: Diagnosis not present

## 2018-04-02 DIAGNOSIS — J301 Allergic rhinitis due to pollen: Secondary | ICD-10-CM | POA: Diagnosis not present

## 2018-04-07 DIAGNOSIS — J3081 Allergic rhinitis due to animal (cat) (dog) hair and dander: Secondary | ICD-10-CM | POA: Diagnosis not present

## 2018-04-07 DIAGNOSIS — J3089 Other allergic rhinitis: Secondary | ICD-10-CM | POA: Diagnosis not present

## 2018-04-07 DIAGNOSIS — J301 Allergic rhinitis due to pollen: Secondary | ICD-10-CM | POA: Diagnosis not present

## 2018-04-09 DIAGNOSIS — J301 Allergic rhinitis due to pollen: Secondary | ICD-10-CM | POA: Diagnosis not present

## 2018-04-09 DIAGNOSIS — J3081 Allergic rhinitis due to animal (cat) (dog) hair and dander: Secondary | ICD-10-CM | POA: Diagnosis not present

## 2018-04-12 DIAGNOSIS — J3089 Other allergic rhinitis: Secondary | ICD-10-CM | POA: Diagnosis not present

## 2018-04-19 ENCOUNTER — Ambulatory Visit: Payer: 59 | Admitting: Nurse Practitioner

## 2018-04-29 DIAGNOSIS — J3089 Other allergic rhinitis: Secondary | ICD-10-CM | POA: Diagnosis not present

## 2018-04-29 DIAGNOSIS — J3081 Allergic rhinitis due to animal (cat) (dog) hair and dander: Secondary | ICD-10-CM | POA: Diagnosis not present

## 2018-04-29 DIAGNOSIS — J301 Allergic rhinitis due to pollen: Secondary | ICD-10-CM | POA: Diagnosis not present

## 2018-05-04 DIAGNOSIS — J301 Allergic rhinitis due to pollen: Secondary | ICD-10-CM | POA: Diagnosis not present

## 2018-05-04 DIAGNOSIS — J3081 Allergic rhinitis due to animal (cat) (dog) hair and dander: Secondary | ICD-10-CM | POA: Diagnosis not present

## 2018-05-04 DIAGNOSIS — J3089 Other allergic rhinitis: Secondary | ICD-10-CM | POA: Diagnosis not present

## 2018-05-13 DIAGNOSIS — J3089 Other allergic rhinitis: Secondary | ICD-10-CM | POA: Diagnosis not present

## 2018-05-13 DIAGNOSIS — J3081 Allergic rhinitis due to animal (cat) (dog) hair and dander: Secondary | ICD-10-CM | POA: Diagnosis not present

## 2018-05-13 DIAGNOSIS — J301 Allergic rhinitis due to pollen: Secondary | ICD-10-CM | POA: Diagnosis not present

## 2018-05-24 DIAGNOSIS — J3089 Other allergic rhinitis: Secondary | ICD-10-CM | POA: Diagnosis not present

## 2018-05-24 DIAGNOSIS — J3081 Allergic rhinitis due to animal (cat) (dog) hair and dander: Secondary | ICD-10-CM | POA: Diagnosis not present

## 2018-05-24 DIAGNOSIS — J301 Allergic rhinitis due to pollen: Secondary | ICD-10-CM | POA: Diagnosis not present

## 2018-05-25 ENCOUNTER — Other Ambulatory Visit: Payer: Self-pay | Admitting: Family Medicine

## 2018-05-25 DIAGNOSIS — F411 Generalized anxiety disorder: Secondary | ICD-10-CM

## 2018-05-26 DIAGNOSIS — J3089 Other allergic rhinitis: Secondary | ICD-10-CM | POA: Diagnosis not present

## 2018-05-26 DIAGNOSIS — J301 Allergic rhinitis due to pollen: Secondary | ICD-10-CM | POA: Diagnosis not present

## 2018-05-26 DIAGNOSIS — J3081 Allergic rhinitis due to animal (cat) (dog) hair and dander: Secondary | ICD-10-CM | POA: Diagnosis not present

## 2018-05-31 DIAGNOSIS — J301 Allergic rhinitis due to pollen: Secondary | ICD-10-CM | POA: Diagnosis not present

## 2018-05-31 DIAGNOSIS — J3081 Allergic rhinitis due to animal (cat) (dog) hair and dander: Secondary | ICD-10-CM | POA: Diagnosis not present

## 2018-05-31 DIAGNOSIS — J3089 Other allergic rhinitis: Secondary | ICD-10-CM | POA: Diagnosis not present

## 2018-06-02 ENCOUNTER — Encounter: Payer: Self-pay | Admitting: Family Medicine

## 2018-06-02 DIAGNOSIS — J301 Allergic rhinitis due to pollen: Secondary | ICD-10-CM | POA: Diagnosis not present

## 2018-06-02 DIAGNOSIS — J3089 Other allergic rhinitis: Secondary | ICD-10-CM | POA: Diagnosis not present

## 2018-06-02 DIAGNOSIS — J3081 Allergic rhinitis due to animal (cat) (dog) hair and dander: Secondary | ICD-10-CM | POA: Diagnosis not present

## 2018-06-02 MED ORDER — EZETIMIBE-SIMVASTATIN 10-10 MG PO TABS: ORAL_TABLET | ORAL | 0 refills | Status: DC

## 2018-06-02 NOTE — Telephone Encounter (Signed)
Sent in 10 tablets to [facility]-Patient aware

## 2018-06-08 DIAGNOSIS — J3081 Allergic rhinitis due to animal (cat) (dog) hair and dander: Secondary | ICD-10-CM | POA: Diagnosis not present

## 2018-06-08 DIAGNOSIS — J3089 Other allergic rhinitis: Secondary | ICD-10-CM | POA: Diagnosis not present

## 2018-06-08 DIAGNOSIS — J301 Allergic rhinitis due to pollen: Secondary | ICD-10-CM | POA: Diagnosis not present

## 2018-06-22 ENCOUNTER — Ambulatory Visit: Payer: 59 | Admitting: Family Medicine

## 2018-06-22 ENCOUNTER — Encounter: Payer: Self-pay | Admitting: Family Medicine

## 2018-06-22 VITALS — BP 138/90 | HR 66 | Temp 99.0°F | Ht 70.0 in | Wt 215.6 lb

## 2018-06-22 DIAGNOSIS — J069 Acute upper respiratory infection, unspecified: Secondary | ICD-10-CM | POA: Diagnosis not present

## 2018-06-22 MED ORDER — PROMETHAZINE-DM 6.25-15 MG/5ML PO SYRP: 5.0000 mL | ORAL_SOLUTION | Freq: Four times a day (QID) | ORAL | 0 refills | Status: DC | PRN

## 2018-06-22 MED ORDER — DOXYCYCLINE HYCLATE 100 MG PO TABS: 100.0000 mg | ORAL_TABLET | Freq: Two times a day (BID) | ORAL | 0 refills | Status: DC

## 2018-06-22 NOTE — Patient Instructions (Addendum)
Great to see you. Happy Holidays!  Continue your nasal spray, albuterol as needed for wheezing. Take doxycyline 100 mg tablet twice daily for 7 days.  Promethazine DM up to four times daily as needed for cough (can make you sleepy).

## 2018-06-22 NOTE — Progress Notes (Signed)
SUBJECTIVE:  Micheal Keller is a 47 y.o. male who complains of congestion, sneezing, sinus pressure productive cough and wheezing for 10 days. He denies a history of fevers and nausea and admits to a history of asthma. Patient denies smoke cigarettes.   Cough is productive of yellow sputum.  Also has PND that worsens his cough when he lays down at night.  Has been taking Mucinex without much relief of symptoms. Current Outpatient Medications on File Prior to Visit  Medication Sig Dispense Refill  . cetirizine (ZYRTEC) 10 MG tablet Take 10 mg by mouth daily.    Marland Kitchen DYMISTA 137-50 MCG/ACT SUSP     . eletriptan (RELPAX) 40 MG tablet TAKE 1 TABLET AS NEEDED FORMIGRAINE MAY REPEAT IN 2   HOURS IF NECESSARY 6 tablet 2  . ezetimibe-simvastatin (VYTORIN) 10-10 MG tablet TAKE 1 TABLET ONCE DAILY 10 tablet 0  . lisinopril (PRINIVIL,ZESTRIL) 10 MG tablet Take 1 tablet (10 mg total) by mouth daily. 90 tablet 1  . promethazine (PHENERGAN) 25 MG tablet Take 25 mg by mouth every 6 (six) hours as needed for nausea.    . sertraline (ZOLOFT) 50 MG tablet TAKE 1 TABLET DAILY (PLEASESCHEDULE APPOINTMENT WITHIN90 DAYS) 30 tablet 0  . topiramate (TOPAMAX) 50 MG tablet TAKE 1 TABLET AT BEDTIME 90 tablet 2   No current facility-administered medications on file prior to visit.     No Known Allergies  Past Medical History:  Diagnosis Date  . Hemorrhoids   . Hyperlipidemia   . Hypertension   . Migraines     Past Surgical History:  Procedure Laterality Date  . HEMORRHOID BANDING      Family History  Problem Relation Age of Onset  . Diabetes Mother   . Hypertension Mother   . Hyperlipidemia Father   . [name] Parkinson White syndrome Brother   . Bone cancer Sister     Social History   Socioeconomic History  . Marital status: Married    Spouse name: Not on file  . Number of children: Not on file  . Years of education: Not on file  . Highest education level: Not on file  Occupational History  . Not on  file  Social Needs  . Financial resource strain: Not on file  . Food insecurity:    Worry: Not on file    Inability: Not on file  . Transportation needs:    Medical: Not on file    Non-medical: Not on file  Tobacco Use  . Smoking status: Former Research scientist (life sciences)  . Smokeless tobacco: Never Used  Substance and Sexual Activity  . Alcohol use: Yes    Alcohol/week: 0.0 standard drinks    Comment: couple drinks a week   . Drug use: No  . Sexual activity: Not on file  Lifestyle  . Physical activity:    Days per week: Not on file    Minutes per session: Not on file  . Stress: Not on file  Relationships  . Social connections:    Talks on phone: Not on file    Gets together: Not on file    Attends religious service: Not on file    Active member of club or organization: Not on file    Attends meetings of clubs or organizations: Not on file    Relationship status: Not on file  . Intimate partner violence:    Fear of current or ex partner: Not on file    Emotionally abused: Not on file    Physically  abused: Not on file    Forced sexual activity: Not on file  Other Topics Concern  . Not on file  Social History Narrative  . Not on file   The PMH, PSH, Social History, Family History, Medications, and allergies have been reviewed in [facility], and have been updated if relevant.  OBJECTIVE: BP 138/90 (BP Location: Left Arm, Patient Position: Sitting, Cuff Size: Normal)   Pulse 66   Temp 99 F (37.2 C) (Oral)   Ht 5\' 10"  (1.778 m)   Wt 215 lb 9.6 oz (97.8 kg)   SpO2 97%   BMI 30.94 kg/m   He appears well, vital signs are as noted. Ears normal.  Throat and pharynx normal.  Neck supple. No adenopathy in the neck. Nose is congested. Maxillary sinuses  tender. Scattered wheezes, otherwise reassuring lung exam.  ASSESSMENT:  sinusitis and bronchitis  PLAN: Symptomatic therapy suggested: push fluids, rest and return office visit prn if symptoms persist or worsen. Call or return to clinic prn if  these symptoms worsen or fail to improve as anticipated.

## 2018-06-24 ENCOUNTER — Other Ambulatory Visit: Payer: Self-pay | Admitting: Family Medicine

## 2018-06-24 DIAGNOSIS — J301 Allergic rhinitis due to pollen: Secondary | ICD-10-CM | POA: Diagnosis not present

## 2018-06-24 DIAGNOSIS — F411 Generalized anxiety disorder: Secondary | ICD-10-CM

## 2018-06-24 DIAGNOSIS — J3089 Other allergic rhinitis: Secondary | ICD-10-CM | POA: Diagnosis not present

## 2018-06-24 DIAGNOSIS — J3081 Allergic rhinitis due to animal (cat) (dog) hair and dander: Secondary | ICD-10-CM | POA: Diagnosis not present

## 2018-07-01 DIAGNOSIS — J3081 Allergic rhinitis due to animal (cat) (dog) hair and dander: Secondary | ICD-10-CM | POA: Diagnosis not present

## 2018-07-01 DIAGNOSIS — J301 Allergic rhinitis due to pollen: Secondary | ICD-10-CM | POA: Diagnosis not present

## 2018-07-01 DIAGNOSIS — J3089 Other allergic rhinitis: Secondary | ICD-10-CM | POA: Diagnosis not present

## 2018-07-05 ENCOUNTER — Ambulatory Visit: Payer: 59 | Admitting: Family Medicine

## 2018-07-05 ENCOUNTER — Encounter: Payer: Self-pay | Admitting: Family Medicine

## 2018-07-05 ENCOUNTER — Ambulatory Visit (INDEPENDENT_AMBULATORY_CARE_PROVIDER_SITE_OTHER): Payer: 59

## 2018-07-05 VITALS — BP 138/100 | HR 57 | Temp 98.7°F | Ht 70.0 in | Wt 213.2 lb

## 2018-07-05 DIAGNOSIS — M79645 Pain in left finger(s): Secondary | ICD-10-CM | POA: Diagnosis not present

## 2018-07-05 DIAGNOSIS — S63639A Sprain of interphalangeal joint of unspecified finger, initial encounter: Secondary | ICD-10-CM | POA: Insufficient documentation

## 2018-07-05 DIAGNOSIS — M7989 Other specified soft tissue disorders: Secondary | ICD-10-CM | POA: Diagnosis not present

## 2018-07-05 DIAGNOSIS — S6992XA Unspecified injury of left wrist, hand and finger(s), initial encounter: Secondary | ICD-10-CM | POA: Diagnosis not present

## 2018-07-05 NOTE — Patient Instructions (Signed)
Good to see you Please try buddy taping at night  I will call you with the results from today  Depending on what the xray shows will depend on the next step.  Happy New Year.

## 2018-07-05 NOTE — Progress Notes (Signed)
Micheal Keller - 47 y.o. male MRN [id number]  Date of birth: Aug 04, 1970  SUBJECTIVE:  Including CC & ROS.  Chief Complaint  Patient presents with  . Hand Pain    middle finger , left hand 6 weeks    Micheal Keller is a 47 y.o. male that is presenting with left middle finger pain.  6 weeks ago he was catching a football and felt like he jammed his finger.  Since that time his PIP has been swollen and painful.  He has not improved during this time.  He has tried splinting at night with no improvement.  He has not taken anything for the pain.  He feels the pain on the radial and ulnar aspect of the PIP joint of the third digit.  He works with his hands daily at work.  Feels like his symptoms are staying the same.  Symptoms are localized to this area.   Review of Systems  Constitutional: Negative for fever.  HENT: Negative for congestion.   Respiratory: Negative for cough.   Cardiovascular: Negative for chest pain.  Gastrointestinal: Negative for abdominal pain.  Musculoskeletal: Positive for joint swelling.  Skin: Negative for color change.  Neurological: Negative for weakness.  Hematological: Negative for adenopathy.  Psychiatric/Behavioral: Negative for agitation.    HISTORY: Past Medical, Surgical, Social, and Family History Reviewed & Updated per EMR.   Pertinent Historical Findings include:  Past Medical History:  Diagnosis Date  . Hemorrhoids   . Hyperlipidemia   . Hypertension   . Migraines     Past Surgical History:  Procedure Laterality Date  . HEMORRHOID BANDING      No Known Allergies  Family History  Problem Relation Age of Onset  . Diabetes Mother   . Hypertension Mother   . Hyperlipidemia Father   . [name] Parkinson White syndrome Brother   . Bone cancer Sister      Social History   Socioeconomic History  . Marital status: Married    Spouse name: Not on file  . Number of children: Not on file  . Years of education: Not on file  . Highest education level: Not  on file  Occupational History  . Not on file  Social Needs  . Financial resource strain: Not on file  . Food insecurity:    Worry: Not on file    Inability: Not on file  . Transportation needs:    Medical: Not on file    Non-medical: Not on file  Tobacco Use  . Smoking status: Former Research scientist (life sciences)  . Smokeless tobacco: Never Used  Substance and Sexual Activity  . Alcohol use: Yes    Alcohol/week: 0.0 standard drinks    Comment: couple drinks a week   . Drug use: No  . Sexual activity: Not on file  Lifestyle  . Physical activity:    Days per week: Not on file    Minutes per session: Not on file  . Stress: Not on file  Relationships  . Social connections:    Talks on phone: Not on file    Gets together: Not on file    Attends religious service: Not on file    Active member of club or organization: Not on file    Attends meetings of clubs or organizations: Not on file    Relationship status: Not on file  . Intimate partner violence:    Fear of current or ex partner: Not on file    Emotionally abused: Not on file  Physically abused: Not on file    Forced sexual activity: Not on file  Other Topics Concern  . Not on file  Social History Narrative  . Not on file     PHYSICAL EXAM:  VS: BP (!) 138/100 (BP Location: Right Arm, Patient Position: Sitting, Cuff Size: Normal)   Pulse (!) 57   Temp 98.7 F (37.1 C) (Oral)   Ht 5\' 10"  (1.778 m)   Wt 213 lb 3.2 oz (96.7 kg)   SpO2 97%   BMI 30.59 kg/m  Physical Exam Gen: NAD, alert, cooperative with exam, well-appearing ENT: normal lips, normal nasal mucosa,  Eye: normal EOM, normal conjunctiva and lids CV:  no edema, +2 pedal pulses   Resp: no accessory muscle use, non-labored,  Skin: no rashes, no areas of induration  Neuro: normal tone, normal sensation to touch Psych:  normal insight, alert and oriented MSK:  Left middle finger: Obviously swollen PIP joint. Normal range of motion. Some tenderness to palpation over  the radial ulnar aspect of the joint. Some instability with valgus and varus testing. Neurovascularly intact  Limited ultrasound: Left third digit:  Dorsal component of the PIP joint appears to be normal. Scanning of radial aspect of the PIP joint reveals thickening of the ligament. Scanning over the volar component of the PIP joint shows an effusion from the joint as well as superficial to the flexor tendon.  Summary: Findings suggestive of a volar plate injury.  Ultrasound and interpretation by Clearance Coots, MD      ASSESSMENT & PLAN:   Pain of finger of left hand It appears that he has ongoing effusion of the PIP joint.  Likely related to capsular tear and a volar plate injury.  Tendons appear to be normal. -X-Braylee today. -Counseled on buddy taping. -If no improvement can consider MRI to evaluate the volar plate.

## 2018-07-05 NOTE — Assessment & Plan Note (Signed)
It appears that he has ongoing effusion of the PIP joint.  Likely related to capsular tear and a volar plate injury.  Tendons appear to be normal. -X-Zoey today. -Counseled on buddy taping. -If no improvement can consider MRI to evaluate the volar plate.

## 2018-07-06 ENCOUNTER — Telehealth: Payer: Self-pay | Admitting: Family Medicine

## 2018-07-06 NOTE — Telephone Encounter (Signed)
Spoke with patient about xray results.   Rosemarie Ax, MD [facility] 07/06/2018, 8:28 AM

## 2018-07-14 DIAGNOSIS — J301 Allergic rhinitis due to pollen: Secondary | ICD-10-CM | POA: Diagnosis not present

## 2018-07-14 DIAGNOSIS — J3089 Other allergic rhinitis: Secondary | ICD-10-CM | POA: Diagnosis not present

## 2018-07-19 ENCOUNTER — Encounter: Payer: Self-pay | Admitting: Family Medicine

## 2018-07-27 ENCOUNTER — Ambulatory Visit: Payer: 59 | Admitting: Family Medicine

## 2018-07-27 ENCOUNTER — Encounter: Payer: Self-pay | Admitting: Family Medicine

## 2018-07-27 VITALS — BP 138/102 | HR 64 | Temp 98.7°F | Ht 70.0 in | Wt 218.6 lb

## 2018-07-27 DIAGNOSIS — S63639D Sprain of interphalangeal joint of unspecified finger, subsequent encounter: Secondary | ICD-10-CM

## 2018-07-27 NOTE — Assessment & Plan Note (Signed)
Has ongoing pain, swelling and instability of the PIP joint. Appears he may have a dislocation with subsequent volar plate injury. Has no improvement with buddy taping and conservative measures. Initial injury was 9 weeks ago.  - MRI finger with attention to middle finger PIP joint  - continue supportive measures.

## 2018-07-27 NOTE — Progress Notes (Signed)
Micheal Keller - 48 y.o. male MRN [id number]  Date of birth: Feb 24, 1971  SUBJECTIVE:  Including CC & ROS.  Chief Complaint  Patient presents with  . Hand Pain    left middle finger     Chrstopher Keller is a 48 y.o. male that is following up for left middle finger pain.  Initial injury was roughly 9 weeks ago.  He had a blunt trauma to his middle finger.  Since then his PIP joint of the middle finger has continued to be swollen and painful.  He is unable completely flex his middle finger.  He has pain on the dorsal aspect as well as the medial and ulnar side of the PIP joint.  He has tried conservative measures with buddy taping as well as splinting at night.  Pain is localized to the PIP joint pain is sharp and stabbing in nature.  Pain is worse with movement.  Independent review of the left middle finger x-Tashaun from 12/30 shows a normal PIP joint.   Review of Systems  Constitutional: Negative for fever.  HENT: Negative for congestion.   Respiratory: Negative for cough.   Cardiovascular: Negative for chest pain.  Gastrointestinal: Negative for abdominal pain.  Musculoskeletal: Positive for joint swelling.  Skin: Negative for color change.  Neurological: Negative for weakness.  Hematological: Negative for adenopathy.  Psychiatric/Behavioral: Negative for agitation.    HISTORY: Past Medical, Surgical, Social, and Family History Reviewed & Updated per EMR.   Pertinent Historical Findings include:  Past Medical History:  Diagnosis Date  . Hemorrhoids   . Hyperlipidemia   . Hypertension   . Migraines     Past Surgical History:  Procedure Laterality Date  . HEMORRHOID BANDING      No Known Allergies  Family History  Problem Relation Age of Onset  . Diabetes Mother   . Hypertension Mother   . Hyperlipidemia Father   . [name] Parkinson White syndrome Brother   . Bone cancer Sister      Social History   Socioeconomic History  . Marital status: Married    Spouse name: Not on file    . Number of children: Not on file  . Years of education: Not on file  . Highest education level: Not on file  Occupational History  . Not on file  Social Needs  . Financial resource strain: Not on file  . Food insecurity:    Worry: Not on file    Inability: Not on file  . Transportation needs:    Medical: Not on file    Non-medical: Not on file  Tobacco Use  . Smoking status: Former Research scientist (life sciences)  . Smokeless tobacco: Never Used  Substance and Sexual Activity  . Alcohol use: Yes    Alcohol/week: 0.0 standard drinks    Comment: couple drinks a week   . Drug use: No  . Sexual activity: Not on file  Lifestyle  . Physical activity:    Days per week: Not on file    Minutes per session: Not on file  . Stress: Not on file  Relationships  . Social connections:    Talks on phone: Not on file    Gets together: Not on file    Attends religious service: Not on file    Active member of club or organization: Not on file    Attends meetings of clubs or organizations: Not on file    Relationship status: Not on file  . Intimate partner violence:    Fear of  current or ex partner: Not on file    Emotionally abused: Not on file    Physically abused: Not on file    Forced sexual activity: Not on file  Other Topics Concern  . Not on file  Social History Narrative  . Not on file     PHYSICAL EXAM:  VS: BP (!) 138/102   Pulse 64   Temp 98.7 F (37.1 C) (Oral)   Ht 5\' 10"  (1.778 m)   Wt 218 lb 9.6 oz (99.2 kg)   SpO2 97%   BMI 31.37 kg/m  Physical Exam Gen: NAD, alert, cooperative with exam, well-appearing ENT: normal lips, normal nasal mucosa,  Eye: normal EOM, normal conjunctiva and lids CV:  no edema, +2 pedal pulses   Resp: no accessory muscle use, non-labored,  Skin: no rashes, no areas of induration  Neuro: normal tone, normal sensation to touch Psych:  normal insight, alert and oriented MSK:  Left middle finger: Obvious swelling of the PIP joint. Mild instability with  stress testing Inability to completely flex or extend. No malrotation. Pain with palpation of the PIP joint. Neurovascular intact     ASSESSMENT & PLAN:   Volar plate injury of finger Has ongoing pain, swelling and instability of the PIP joint. Appears he may have a dislocation with subsequent volar plate injury. Has no improvement with buddy taping and conservative measures. Initial injury was 9 weeks ago.  - MRI finger with attention to middle finger PIP joint  - continue supportive measures.

## 2018-07-27 NOTE — Patient Instructions (Signed)
Good to see you  We'll get the MRI and I will call you after it is completed.

## 2018-07-28 ENCOUNTER — Encounter: Payer: Self-pay | Admitting: Family Medicine

## 2018-07-30 ENCOUNTER — Other Ambulatory Visit: Payer: Self-pay

## 2018-07-30 DIAGNOSIS — E785 Hyperlipidemia, unspecified: Secondary | ICD-10-CM

## 2018-07-30 DIAGNOSIS — I10 Essential (primary) hypertension: Secondary | ICD-10-CM

## 2018-07-30 MED ORDER — LISINOPRIL 10 MG PO TABS: 10.0000 mg | ORAL_TABLET | Freq: Every day | ORAL | 1 refills | Status: DC

## 2018-07-30 MED ORDER — EZETIMIBE-SIMVASTATIN 10-10 MG PO TABS: ORAL_TABLET | ORAL | 0 refills | Status: DC

## 2018-08-03 ENCOUNTER — Ambulatory Visit
Admission: RE | Admit: 2018-08-03 | Discharge: 2018-08-03 | Disposition: A | Discharge status: Home / Self Care | Payer: 59 | Source: Ambulatory Visit | Attending: Family Medicine | Admitting: Family Medicine

## 2018-08-03 ENCOUNTER — Telehealth: Payer: Self-pay | Admitting: Family Medicine

## 2018-08-03 DIAGNOSIS — S63639D Sprain of interphalangeal joint of unspecified finger, subsequent encounter: Secondary | ICD-10-CM

## 2018-08-03 NOTE — Telephone Encounter (Signed)
Spoke with patient about results. Will refer to hand surgery.   Rosemarie Ax, MD [facility] 08/03/2018, 12:21 PM

## 2018-08-10 ENCOUNTER — Ambulatory Visit: Payer: 59 | Admitting: Family Medicine

## 2018-08-10 ENCOUNTER — Encounter: Payer: Self-pay | Admitting: Family Medicine

## 2018-08-10 VITALS — BP 136/80 | HR 103 | Temp 101.3°F | Ht 70.0 in | Wt 217.1 lb

## 2018-08-10 DIAGNOSIS — J111 Influenza due to unidentified influenza virus with other respiratory manifestations: Secondary | ICD-10-CM | POA: Insufficient documentation

## 2018-08-10 DIAGNOSIS — R509 Fever, unspecified: Secondary | ICD-10-CM | POA: Diagnosis not present

## 2018-08-10 LAB — POC INFLUENZA A&B (BINAX/QUICKVUE)
Influenza A, POC: NEGATIVE
Influenza B, POC: NEGATIVE

## 2018-08-10 MED ORDER — PROMETHAZINE-DM 6.25-15 MG/5ML PO SYRP: 5.0000 mL | ORAL_SOLUTION | Freq: Four times a day (QID) | ORAL | 0 refills | Status: DC | PRN

## 2018-08-10 MED ORDER — OSELTAMIVIR PHOSPHATE 75 MG PO CAPS: 75.0000 mg | ORAL_CAPSULE | Freq: Two times a day (BID) | ORAL | 0 refills | Status: AC

## 2018-08-10 NOTE — Progress Notes (Signed)
Established Patient Office Visit  Subjective:  Patient ID: Micheal Keller, male    DOB: [birth year]  Age: 48 y.o. MRN: [id number]  CC:  Chief Complaint  Patient presents with  . cough/congestion    HPI Micheal Keller presents for evaluation and treatment of 1 day history of abrupt onset of fever chills, headache, nasal congestion postnasal drip cough with myalgias and arthralgias.  Status post air travel from [place] 2 days ago.  Patient denies wheezing or an asthma history but feels as though he is having difficulty coughing mucus out from his chest.  Quit tobacco 15 years ago.  History of pneumonia in his past.  Past Medical History:  Diagnosis Date  . Hemorrhoids   . Hyperlipidemia   . Hypertension   . Migraines     Past Surgical History:  Procedure Laterality Date  . HEMORRHOID BANDING      Family History  Problem Relation Age of Onset  . Diabetes Mother   . Hypertension Mother   . Hyperlipidemia Father   . [name] Parkinson White syndrome Brother   . Bone cancer Sister     Social History   Socioeconomic History  . Marital status: Married    Spouse name: Not on file  . Number of children: Not on file  . Years of education: Not on file  . Highest education level: Not on file  Occupational History  . Not on file  Social Needs  . Financial resource strain: Not on file  . Food insecurity:    Worry: Not on file    Inability: Not on file  . Transportation needs:    Medical: Not on file    Non-medical: Not on file  Tobacco Use  . Smoking status: Former Research scientist (life sciences)  . Smokeless tobacco: Never Used  Substance and Sexual Activity  . Alcohol use: Yes    Alcohol/week: 0.0 standard drinks    Comment: couple drinks a week   . Drug use: No  . Sexual activity: Not on file  Lifestyle  . Physical activity:    Days per week: Not on file    Minutes per session: Not on file  . Stress: Not on file  Relationships  . Social connections:    Talks on phone: Not on file    Gets  together: Not on file    Attends religious service: Not on file    Active member of club or organization: Not on file    Attends meetings of clubs or organizations: Not on file    Relationship status: Not on file  . Intimate partner violence:    Fear of current or ex partner: Not on file    Emotionally abused: Not on file    Physically abused: Not on file    Forced sexual activity: Not on file  Other Topics Concern  . Not on file  Social History Narrative  . Not on file    Outpatient Medications Prior to Visit  Medication Sig Dispense Refill  . cetirizine (ZYRTEC) 10 MG tablet Take 10 mg by mouth daily.    Marland Kitchen DYMISTA 137-50 MCG/ACT SUSP     . eletriptan (RELPAX) 40 MG tablet TAKE 1 TABLET AS NEEDED FORMIGRAINE MAY REPEAT IN 2   HOURS IF NECESSARY 6 tablet 2  . ezetimibe-simvastatin (VYTORIN) 10-10 MG tablet TAKE 1 TABLET ONCE DAILY 90 tablet 0  . lisinopril (PRINIVIL,ZESTRIL) 10 MG tablet Take 1 tablet (10 mg total) by mouth daily. 90 tablet 1  . promethazine (PHENERGAN)  25 MG tablet Take 25 mg by mouth every 6 (six) hours as needed for nausea.    . sertraline (ZOLOFT) 50 MG tablet TAKE 1 TABLET DAILY (PLEASE              SCHEDULE APPOINTMENT WITHIN              90 DAYS) (NEEDS OFFICE VISIT FOR FUTURE FILLS) 30 tablet 0  . topiramate (TOPAMAX) 50 MG tablet TAKE 1 TABLET AT BEDTIME 90 tablet 2  . doxycycline (VIBRA-TABS) 100 MG tablet Take 1 tablet (100 mg total) by mouth 2 (two) times daily. (Patient not taking: Reported on 07/27/2018) 14 tablet 0  . promethazine-dextromethorphan (PROMETHAZINE-DM) 6.25-15 MG/5ML syrup Take 5 mLs by mouth 4 (four) times daily as needed. 118 mL 0   No facility-administered medications prior to visit.     No Known Allergies  ROS Review of Systems  Constitutional: Positive for chills, fatigue and fever. Negative for diaphoresis and unexpected weight change.  HENT: Positive for congestion, postnasal drip and rhinorrhea. Negative for ear pain, sinus  pressure, sinus pain, sore throat and voice change.   Eyes: Negative for photophobia and visual disturbance.  Respiratory: Positive for cough. Negative for shortness of breath and wheezing.   Cardiovascular: Negative.   Gastrointestinal: Negative.   Musculoskeletal: Positive for arthralgias and myalgias.  Allergic/Immunologic: Negative for immunocompromised state.  Neurological: Positive for headaches. Negative for light-headedness.  Hematological: Does not bruise/bleed easily.  Psychiatric/Behavioral: Negative.       Objective:    Physical Exam  Constitutional: He is oriented to person, place, and time. He appears well-developed and well-nourished. No distress.  HENT:  Head: Normocephalic and atraumatic.  Right Ear: External ear normal.  Left Ear: External ear normal.  Mouth/Throat: Oropharynx is clear and moist. No oropharyngeal exudate.  Eyes: Pupils are equal, round, and reactive to light. Conjunctivae are normal. Right eye exhibits no discharge. Left eye exhibits no discharge. No scleral icterus.  Neck: Neck supple. No JVD present. No tracheal deviation present. No thyromegaly present.  Cardiovascular: Normal rate, regular rhythm and normal heart sounds.  Pulmonary/Chest: Effort normal and breath sounds normal. No stridor. He has no decreased breath sounds. He has no wheezes. He has no rhonchi. He has no rales.  Abdominal: Bowel sounds are normal.  Lymphadenopathy:    He has no cervical adenopathy.  Neurological: He is oriented to person, place, and time.  Skin: Skin is warm and dry. He is not diaphoretic.  Psychiatric: He has a normal mood and affect. His behavior is normal.    BP 136/80   Pulse (!) 103   Temp (!) 101.3 F (38.5 C) (Oral)   Ht 5\' 10"  (1.778 m)   Wt 217 lb 2 oz (98.5 kg)   SpO2 96%   BMI 31.15 kg/m  Wt Readings from Last 3 Encounters:  08/10/18 217 lb 2 oz (98.5 kg)  07/27/18 218 lb 9.6 oz (99.2 kg)  07/05/18 213 lb 3.2 oz (96.7 kg)   BP Readings  from Last 3 Encounters:  08/10/18 136/80  07/27/18 (!) 138/102  07/05/18 (!) 138/100   Guideline developer:  UpToDate (see UpToDate for funding source) Date Released: June 2014  There are no preventive care reminders to display for this patient.  There are no preventive care reminders to display for this patient.  No results found for: TSH Lab Results  Component Value Date   WBC 4.9 09/20/2012   HGB 13.4 09/20/2012   HCT 40.2 09/20/2012  MCV 90.9 09/20/2012   PLT 260.0 09/20/2012   Lab Results  Component Value Date   NA 139 11/13/2016   K 3.7 11/13/2016   CO2 27 11/13/2016   GLUCOSE 109 (H) 11/13/2016   BUN 12 11/13/2016   CREATININE 1.05 11/13/2016   BILITOT 0.5 11/13/2016   ALKPHOS 60 11/13/2016   AST 26 11/13/2016   ALT 29 11/13/2016   PROT 6.9 11/13/2016   ALBUMIN 4.5 11/13/2016   CALCIUM 9.4 11/13/2016   GFR 98.64 11/13/2016   Lab Results  Component Value Date   CHOL 186 11/13/2016   Lab Results  Component Value Date   HDL 41.60 11/13/2016   Lab Results  Component Value Date   LDLCALC 128 (H) 11/13/2016   Lab Results  Component Value Date   TRIG 84.0 11/13/2016   Lab Results  Component Value Date   CHOLHDL 4 11/13/2016   No results found for: HGBA1C    Assessment & Plan:   Problem List Items Addressed This Visit      Respiratory   Flu syndrome   Relevant Medications   oseltamivir (TAMIFLU) 75 MG capsule   promethazine-dextromethorphan (PROMETHAZINE-DM) 6.25-15 MG/5ML syrup     Other   Fever - Primary   Relevant Orders   POC Influenza A&B(BINAX/QUICKVUE) (Completed)      Meds ordered this encounter  Medications  . oseltamivir (TAMIFLU) 75 MG capsule    Sig: Take 1 capsule (75 mg total) by mouth 2 (two) times daily for 5 days.    Dispense:  10 capsule    Refill:  0  . promethazine-dextromethorphan (PROMETHAZINE-DM) 6.25-15 MG/5ML syrup    Sig: Take 5 mLs by mouth 4 (four) times daily as needed for cough.    Dispense:  118 mL     Refill:  0    Follow-up: Return in about 5 days (around 08/15/2018), or if symptoms worsen or fail to improve.    Patient was given information on influenza.  Stressed the importance of follow-up with his past medical history of pneumonia if he does not improve with the Tamiflu.

## 2018-08-10 NOTE — Patient Instructions (Signed)
Viral Respiratory Infection A respiratory infection is an illness that affects part of the respiratory system, such as the lungs, nose, or throat. A respiratory infection that is caused by a virus is called a viral respiratory infection. Common types of viral respiratory infections include:  A cold.  The flu (influenza).  A respiratory syncytial virus (RSV) infection. What are the causes? This condition is caused by a virus. What are the signs or symptoms? Symptoms of this condition include:  A stuffy or runny nose.  Yellow or green nasal discharge.  A cough.  Sneezing.  Fatigue.  Achy muscles.  A sore throat.  Sweating or chills.  A fever.  A headache. How is this diagnosed? This condition may be diagnosed based on:  Your symptoms.  A physical exam.  Testing of nasal swabs. How is this treated? This condition may be treated with medicines, such as:  Antiviral medicine. This may shorten the length of time a person has symptoms.  Expectorants. These make it easier to cough up mucus.  Decongestant nasal sprays.  Acetaminophen or NSAIDs to relieve fever and pain. Antibiotic medicines are not prescribed for viral infections. This is because antibiotics are designed to kill bacteria. They are not effective against viruses. Follow these instructions at home:  Managing pain and congestion  Take over-the-counter and prescription medicines only as told by your health care provider.  If you have a sore throat, gargle with a salt-water mixture 3-4 times a day or as needed. To make a salt-water mixture, completely dissolve -1 tsp of salt in 1 cup of warm water.  Use nose drops made from salt water to ease congestion and soften raw skin around your nose.  Drink enough fluid to keep your urine pale yellow. This helps prevent dehydration and helps loosen up mucus. General instructions  Rest as much as possible.  Do not drink alcohol.  Do not use any products  that contain nicotine or tobacco, such as cigarettes and e-cigarettes. If you need help quitting, ask your health care provider.  Keep all follow-up visits as told by your health care provider. This is important. How is this prevented?   Get an annual flu shot. You may get the flu shot in late summer, fall, or winter. Ask your health care provider when you should get your flu shot.  Avoid exposing others to your respiratory infection. ? Stay home from work or school as told by your health care provider. ? Wash your hands with soap and water often, especially after you cough or sneeze. If soap and water are not available, use alcohol-based hand sanitizer.  Avoid contact with people who are sick during cold and flu season. This is generally fall and winter. Contact a health care provider if:  Your symptoms last for 10 days or longer.  Your symptoms get worse over time.  You have a fever.  You have severe sinus pain in your face or forehead.  The glands in your jaw or neck become very swollen. Get help right away if you:  Feel pain or pressure in your chest.  Have shortness of breath.  Faint or feel like you will faint.  Have severe and persistent vomiting.  Feel confused or disoriented. Summary  A respiratory infection is an illness that affects part of the respiratory system, such as the lungs, nose, or throat. A respiratory infection that is caused by a virus is called a viral respiratory infection.  Common types of viral respiratory infections are a   cold, influenza, and respiratory syncytial virus (RSV) infection.  Symptoms of this condition include a stuffy or runny nose, cough, sneezing, fatigue, achy muscles, sore throat, and fevers or chills.  Antibiotic medicines are not prescribed for viral infections. This is because antibiotics are designed to kill bacteria. They are not effective against viruses. This information is not intended to replace advice given to you by  your health care provider. Make sure you discuss any questions you have with your health care provider. Document Released: 04/02/2005 Document Revised: 08/03/2017 Document Reviewed: 08/03/2017 Elsevier Interactive Patient Education  2019 [employer].  Influenza, Adult Influenza, more commonly known as "the flu," is a viral infection that mainly affects the respiratory tract. The respiratory tract includes organs that help you breathe, such as the lungs, nose, and throat. The flu causes many symptoms similar to the common cold along with high fever and body aches. The flu spreads easily from person to person (is contagious). Getting a flu shot (influenza vaccination) every year is the best way to prevent the flu. What are the causes? This condition is caused by the influenza virus. You can get the virus by:  Breathing in droplets that are in the air from an infected person's cough or sneeze.  Touching something that has been exposed to the virus (has been contaminated) and then touching your mouth, nose, or eyes. What increases the risk? The following factors may make you more likely to get the flu:  Not washing or sanitizing your hands often.  Having close contact with many people during cold and flu season.  Touching your mouth, eyes, or nose without first washing or sanitizing your hands.  Not getting a yearly (annual) flu shot. You may have a higher risk for the flu, including serious problems such as a lung infection (pneumonia), if you:  Are older than 65.  Are pregnant.  Have a weakened disease-fighting system (immune system). You may have a weakened immune system if you: ? Have HIV or AIDS. ? Are undergoing chemotherapy. ? Are taking medicines that reduce (suppress) the activity of your immune system.  Have a long-term (chronic) illness, such as heart disease, kidney disease, diabetes, or lung disease.  Have a liver disorder.  Are severely overweight (morbidly  obese).  Have anemia. This is a condition that affects your red blood cells.  Have asthma. What are the signs or symptoms? Symptoms of this condition usually begin suddenly and last 4-14 days. They may include:  Fever and chills.  Headaches, body aches, or muscle aches.  Sore throat.  Cough.  Runny or stuffy (congested) nose.  Chest discomfort.  Poor appetite.  Weakness or fatigue.  Dizziness.  Nausea or vomiting. How is this diagnosed? This condition may be diagnosed based on:  Your symptoms and medical history.  A physical exam.  Swabbing your nose or throat and testing the fluid for the influenza virus. How is this treated? If the flu is diagnosed early, you can be treated with medicine that can help reduce how severe the illness is and how long it lasts (antiviral medicine). This may be given by mouth (orally) or through an IV. Taking care of yourself at home can help relieve symptoms. Your health care provider may recommend:  Taking over-the-counter medicines.  Drinking plenty of fluids. In many cases, the flu goes away on its own. If you have severe symptoms or complications, you may be treated in a hospital. Follow these instructions at home: Activity  Rest as needed and  get plenty of sleep.  Stay home from work or school as told by your health care provider. Unless you are visiting your health care provider, avoid leaving home until your fever has been gone for 24 hours without taking medicine. Eating and drinking  Take an oral rehydration solution (ORS). This is a drink that is sold at pharmacies and retail stores.  Drink enough fluid to keep your urine pale yellow.  Drink clear fluids in small amounts as you are able. Clear fluids include water, ice chips, diluted fruit juice, and low-calorie sports drinks.  Eat bland, easy-to-digest foods in small amounts as you are able. These foods include bananas, applesauce, rice, lean meats, toast, and  crackers.  Avoid drinking fluids that contain a lot of sugar or caffeine, such as energy drinks, regular sports drinks, and soda.  Avoid alcohol.  Avoid spicy or fatty foods. General instructions      Take over-the-counter and prescription medicines only as told by your health care provider.  Use a cool mist humidifier to add humidity to the air in your home. This can make it easier to breathe.  Cover your mouth and nose when you cough or sneeze.  Wash your hands with soap and water often, especially after you cough or sneeze. If soap and water are not available, use alcohol-based hand sanitizer.  Keep all follow-up visits as told by your health care provider. This is important. How is this prevented?   Get an annual flu shot. You may get the flu shot in late summer, fall, or winter. Ask your health care provider when you should get your flu shot.  Avoid contact with people who are sick during cold and flu season. This is generally fall and winter. Contact a health care provider if:  You develop new symptoms.  You have: ? Chest pain. ? Diarrhea. ? A fever.  Your cough gets worse.  You produce more mucus.  You feel nauseous or you vomit. Get help right away if:  You develop shortness of breath or difficulty breathing.  Your skin or nails turn a bluish color.  You have severe pain or stiffness in your neck.  You develop a sudden headache or sudden pain in your face or ear.  You cannot eat or drink without vomiting. Summary  Influenza, more commonly known as "the flu," is a viral infection that primarily affects your respiratory tract.  Symptoms of the flu usually begin suddenly and last 4-14 days.  Getting an annual flu shot is the best way to prevent getting the flu.  Stay home from work or school as told by your health care provider. Unless you are visiting your health care provider, avoid leaving home until your fever has been gone for 24 hours without  taking medicine.  Keep all follow-up visits as told by your health care provider. This is important. This information is not intended to replace advice given to you by your health care provider. Make sure you discuss any questions you have with your health care provider. Document Released: 06/20/2000 Document Revised: 12/09/2017 Document Reviewed: 12/09/2017 Elsevier Interactive Patient Education  2019 [employer].

## 2018-09-02 ENCOUNTER — Other Ambulatory Visit: Payer: Self-pay

## 2018-09-02 DIAGNOSIS — F411 Generalized anxiety disorder: Secondary | ICD-10-CM

## 2018-09-02 MED ORDER — SERTRALINE HCL 50 MG PO TABS: ORAL_TABLET | ORAL | 1 refills | Status: DC

## 2018-09-07 DIAGNOSIS — J301 Allergic rhinitis due to pollen: Secondary | ICD-10-CM | POA: Diagnosis not present

## 2018-09-07 DIAGNOSIS — J3089 Other allergic rhinitis: Secondary | ICD-10-CM | POA: Diagnosis not present

## 2018-09-07 DIAGNOSIS — J3081 Allergic rhinitis due to animal (cat) (dog) hair and dander: Secondary | ICD-10-CM | POA: Diagnosis not present

## 2018-09-17 DIAGNOSIS — J3089 Other allergic rhinitis: Secondary | ICD-10-CM | POA: Diagnosis not present

## 2018-09-17 DIAGNOSIS — J301 Allergic rhinitis due to pollen: Secondary | ICD-10-CM | POA: Diagnosis not present

## 2018-09-23 DIAGNOSIS — J301 Allergic rhinitis due to pollen: Secondary | ICD-10-CM | POA: Diagnosis not present

## 2018-09-23 DIAGNOSIS — J3081 Allergic rhinitis due to animal (cat) (dog) hair and dander: Secondary | ICD-10-CM | POA: Diagnosis not present

## 2018-09-23 DIAGNOSIS — J3089 Other allergic rhinitis: Secondary | ICD-10-CM | POA: Diagnosis not present

## 2018-09-28 DIAGNOSIS — J3089 Other allergic rhinitis: Secondary | ICD-10-CM | POA: Diagnosis not present

## 2018-09-28 DIAGNOSIS — J3081 Allergic rhinitis due to animal (cat) (dog) hair and dander: Secondary | ICD-10-CM | POA: Diagnosis not present

## 2018-09-28 DIAGNOSIS — J301 Allergic rhinitis due to pollen: Secondary | ICD-10-CM | POA: Diagnosis not present

## 2018-10-07 DIAGNOSIS — J301 Allergic rhinitis due to pollen: Secondary | ICD-10-CM | POA: Diagnosis not present

## 2018-10-07 DIAGNOSIS — J3089 Other allergic rhinitis: Secondary | ICD-10-CM | POA: Diagnosis not present

## 2018-10-07 DIAGNOSIS — J3081 Allergic rhinitis due to animal (cat) (dog) hair and dander: Secondary | ICD-10-CM | POA: Diagnosis not present

## 2018-10-14 DIAGNOSIS — J3089 Other allergic rhinitis: Secondary | ICD-10-CM | POA: Diagnosis not present

## 2018-10-14 DIAGNOSIS — J301 Allergic rhinitis due to pollen: Secondary | ICD-10-CM | POA: Diagnosis not present

## 2018-10-14 DIAGNOSIS — J3081 Allergic rhinitis due to animal (cat) (dog) hair and dander: Secondary | ICD-10-CM | POA: Diagnosis not present

## 2018-10-15 ENCOUNTER — Other Ambulatory Visit: Payer: Self-pay | Admitting: Family Medicine

## 2018-10-15 DIAGNOSIS — E785 Hyperlipidemia, unspecified: Secondary | ICD-10-CM

## 2018-10-21 DIAGNOSIS — J3081 Allergic rhinitis due to animal (cat) (dog) hair and dander: Secondary | ICD-10-CM | POA: Diagnosis not present

## 2018-10-21 DIAGNOSIS — J301 Allergic rhinitis due to pollen: Secondary | ICD-10-CM | POA: Diagnosis not present

## 2018-10-21 DIAGNOSIS — J3089 Other allergic rhinitis: Secondary | ICD-10-CM | POA: Diagnosis not present

## 2018-11-02 DIAGNOSIS — J3089 Other allergic rhinitis: Secondary | ICD-10-CM | POA: Diagnosis not present

## 2018-11-02 DIAGNOSIS — J3081 Allergic rhinitis due to animal (cat) (dog) hair and dander: Secondary | ICD-10-CM | POA: Diagnosis not present

## 2018-11-02 DIAGNOSIS — J301 Allergic rhinitis due to pollen: Secondary | ICD-10-CM | POA: Diagnosis not present

## 2018-11-04 DIAGNOSIS — J301 Allergic rhinitis due to pollen: Secondary | ICD-10-CM | POA: Diagnosis not present

## 2018-11-04 DIAGNOSIS — J3081 Allergic rhinitis due to animal (cat) (dog) hair and dander: Secondary | ICD-10-CM | POA: Diagnosis not present

## 2018-11-04 DIAGNOSIS — J3089 Other allergic rhinitis: Secondary | ICD-10-CM | POA: Diagnosis not present

## 2018-11-05 DIAGNOSIS — J3089 Other allergic rhinitis: Secondary | ICD-10-CM | POA: Diagnosis not present

## 2018-11-11 DIAGNOSIS — J301 Allergic rhinitis due to pollen: Secondary | ICD-10-CM | POA: Diagnosis not present

## 2018-11-11 DIAGNOSIS — J3081 Allergic rhinitis due to animal (cat) (dog) hair and dander: Secondary | ICD-10-CM | POA: Diagnosis not present

## 2018-11-11 DIAGNOSIS — J3089 Other allergic rhinitis: Secondary | ICD-10-CM | POA: Diagnosis not present

## 2018-11-23 ENCOUNTER — Other Ambulatory Visit: Payer: Self-pay

## 2018-11-23 DIAGNOSIS — I10 Essential (primary) hypertension: Secondary | ICD-10-CM

## 2018-11-23 DIAGNOSIS — Z125 Encounter for screening for malignant neoplasm of prostate: Secondary | ICD-10-CM

## 2018-11-23 DIAGNOSIS — E785 Hyperlipidemia, unspecified: Secondary | ICD-10-CM

## 2018-11-23 NOTE — Progress Notes (Signed)
Future orders placed for visit on 5.21.20 with TA/thx dmf

## 2018-11-24 ENCOUNTER — Other Ambulatory Visit (INDEPENDENT_AMBULATORY_CARE_PROVIDER_SITE_OTHER): Payer: 59

## 2018-11-24 DIAGNOSIS — E785 Hyperlipidemia, unspecified: Secondary | ICD-10-CM

## 2018-11-24 DIAGNOSIS — Z125 Encounter for screening for malignant neoplasm of prostate: Secondary | ICD-10-CM

## 2018-11-24 DIAGNOSIS — I10 Essential (primary) hypertension: Secondary | ICD-10-CM | POA: Diagnosis not present

## 2018-11-24 LAB — COMPREHENSIVE METABOLIC PANEL
ALT: 44 U/L (ref 0–53)
AST: 38 U/L — ABNORMAL HIGH (ref 0–37)
Albumin: 4.6 g/dL (ref 3.5–5.2)
Alkaline Phosphatase: 61 U/L (ref 39–117)
BUN: 12 mg/dL (ref 6–23)
CO2: 27 mEq/L (ref 19–32)
Calcium: 9.3 mg/dL (ref 8.4–10.5)
Chloride: 105 mEq/L (ref 96–112)
Creatinine, Ser: 0.96 mg/dL (ref 0.40–1.50)
GFR: 101.98 mL/min (ref >60.00)
Glucose, Bld: 103 mg/dL — ABNORMAL HIGH (ref 70–99)
Potassium: 4 mEq/L (ref 3.5–5.1)
Sodium: 140 mEq/L (ref 135–145)
Total Bilirubin: 0.5 mg/dL (ref 0.2–1.2)
Total Protein: 7.3 g/dL (ref 6.0–8.3)

## 2018-11-24 LAB — LIPID PANEL
Cholesterol: 168 mg/dL (ref 0–200)
HDL: 38.5 mg/dL — ABNORMAL LOW (ref >39.00)
LDL Cholesterol: 114 mg/dL — ABNORMAL HIGH (ref 0–99)
NonHDL: 129.98
Total CHOL/HDL Ratio: 4
Triglycerides: 81 mg/dL (ref 0.0–149.0)
VLDL: 16.2 mg/dL (ref 0.0–40.0)

## 2018-11-24 LAB — CBC WITH DIFFERENTIAL/PLATELET
Basophils Absolute: 0 10*3/uL (ref 0.0–0.1)
Basophils Relative: 0.7 % (ref 0.0–3.0)
Eosinophils Absolute: 0.1 10*3/uL (ref 0.0–0.7)
Eosinophils Relative: 1.7 % (ref 0.0–5.0)
HCT: 41.5 % (ref 39.0–52.0)
Hemoglobin: 14.1 g/dL (ref 13.0–17.0)
Lymphocytes Relative: 54.3 % — ABNORMAL HIGH (ref 12.0–46.0)
Lymphs Abs: 2.3 10*3/uL (ref 0.7–4.0)
MCHC: 34 g/dL (ref 30.0–36.0)
MCV: 91.8 fl (ref 78.0–100.0)
Monocytes Absolute: 0.3 10*3/uL (ref 0.1–1.0)
Monocytes Relative: 7.2 % (ref 3.0–12.0)
Neutro Abs: 1.5 10*3/uL (ref 1.4–7.7)
Neutrophils Relative %: 36.1 % — ABNORMAL LOW (ref 43.0–77.0)
Platelets: 250 10*3/uL (ref 150.0–400.0)
RBC: 4.53 Mil/uL (ref 4.22–5.81)
RDW: 12.9 % (ref 11.5–15.5)
WBC: 4.2 10*3/uL (ref 4.0–10.5)

## 2018-11-24 LAB — PSA: PSA: 0.9 ng/mL (ref 0.10–4.00)

## 2018-11-24 NOTE — Progress Notes (Signed)
Virtual Visit via Video   Due to the COVID-19 pandemic, this visit was completed with telemedicine (audio/video) technology to reduce patient and provider exposure as well as to preserve personal protective equipment.   I connected with Micheal Keller by a video enabled telemedicine application and verified that I am speaking with the correct person using two identifiers. Location patient: Home Location provider: [facility], Office Persons participating in the virtual visit: Micheal Keller, Arnette Norris, MD   I discussed the limitations of evaluation and management by telemedicine and the availability of in person appointments. The patient expressed understanding and agreed to proceed.  Care Team   Patient Care Team: Lucille Passy, MD as PCP - General (Family Medicine)  Subjective:   HPI:   Follow up- had fasting labs done yesterday.  He is doing well.  Has to go into work everyday ut they are provided N95 masks and gloves.  He feels safe.  HLD- has been taking Vytorin daily.  HDL is a bit low but admits to not exercising as much since the pandemic.  Lab Results  Component Value Date   CHOL 168 11/24/2018   HDL 38.50 (L) 11/24/2018   LDLCALC 114 (H) 11/24/2018   TRIG 81.0 11/24/2018   CHOLHDL 4 11/24/2018   Lab Results  Component Value Date   ALT 44 11/24/2018   AST 38 (H) 11/24/2018   ALKPHOS 61 11/24/2018   BILITOT 0.5 11/24/2018     HTN Has been taking Lisinopril 10 mg daily.  He will call me back with a BP reading later today but has not felt like his BP is elevated.  Lab Results  Component Value Date   CREATININE 0.96 11/24/2018   BP Readings from Last 3 Encounters:  08/10/18 136/80  07/27/18 (!) 138/102  07/05/18 (!) 138/100   Lab Results  Component Value Date   NA 140 11/24/2018   K 4.0 11/24/2018   CL 105 11/24/2018   CO2 27 11/24/2018   Lab Results  Component Value Date   PSA 0.90 11/24/2018   PSA 0.82 11/13/2016   Migraines- have been well  controlled on Topamax.  Cannot remember the last time he had a migraine.   Anxiety- feels great on current dose of zoloft and would like to continue it.   Review of Systems  Constitutional: Negative.   HENT: Negative.   Eyes: Negative.   Respiratory: Negative.   Cardiovascular: Negative.   Gastrointestinal: Negative.   Genitourinary: Negative.   Musculoskeletal: Negative.   Skin: Negative.   Neurological: Negative.   Endo/Heme/Allergies: Negative.   Psychiatric/Behavioral: Negative.   All other systems reviewed and are negative.    Patient Active Problem List   Diagnosis Date Noted  . Fever 08/10/2018  . GAD (generalized anxiety disorder) 01/02/2016  . Adjustment disorder with mixed anxiety and depressed mood 02/17/2014  . Allergic rhinitis 07/13/2013  . Migraine 09/09/2012  . Family history of cardiac arrhythmia 09/09/2012  . Hypertension   . Hyperlipidemia     Social History   Tobacco Use  . Smoking status: Former Research scientist (life sciences)  . Smokeless tobacco: Never Used  Substance Use Topics  . Alcohol use: Yes    Alcohol/week: 0.0 standard drinks    Comment: couple drinks a week     Current Outpatient Medications:  .  cetirizine (ZYRTEC) 10 MG tablet, Take 10 mg by mouth daily., Disp: , Rfl:  .  DYMISTA 137-50 MCG/ACT SUSP, , Disp: , Rfl:  .  eletriptan (RELPAX) 40  MG tablet, TAKE 1 TABLET AS NEEDED FORMIGRAINE MAY REPEAT IN 2   HOURS IF NECESSARY, Disp: 6 tablet, Rfl: 2 .  ezetimibe-simvastatin (VYTORIN) 10-10 MG tablet, TAKE 1 TABLET BY MOUTH ONCE DAILY, Disp: 90 tablet, Rfl: 1 .  lisinopril (PRINIVIL,ZESTRIL) 10 MG tablet, Take 1 tablet (10 mg total) by mouth daily., Disp: 90 tablet, Rfl: 1 .  promethazine (PHENERGAN) 25 MG tablet, Take 25 mg by mouth every 6 (six) hours as needed for nausea., Disp: , Rfl:  .  promethazine-dextromethorphan (PROMETHAZINE-DM) 6.25-15 MG/5ML syrup, Take 5 mLs by mouth 4 (four) times daily as needed for cough., Disp: 118 mL, Rfl: 0 .  sertraline  (ZOLOFT) 50 MG tablet, TAKE 1 TABLET DAILY, Disp: 90 tablet, Rfl: 1 .  topiramate (TOPAMAX) 50 MG tablet, TAKE 1 TABLET AT BEDTIME, Disp: 90 tablet, Rfl: 2  No Known Allergies  Objective:  There were no vitals taken for this visit.  VITALS: Per patient if applicable, see vitals. GENERAL: Alert, appears well and in no acute distress. HEENT: Atraumatic, conjunctiva clear, no obvious abnormalities on inspection of external nose and ears. NECK: Normal movements of the head and neck. CARDIOPULMONARY: No increased WOB. Speaking in clear sentences. I:E ratio WNL.  MS: Moves all visible extremities without noticeable abnormality. PSYCH: Pleasant and cooperative, well-groomed. Speech normal rate and rhythm. Affect is appropriate. Insight and judgement are appropriate. Attention is focused, linear, and appropriate.  NEURO: CN grossly intact. Oriented as arrived to appointment on time with no prompting. Moves both UE equally.  SKIN: No obvious lesions, wounds, erythema, or cyanosis noted on face or hands.  Depression screen PHQ 2/9 07/20/2017  Decreased Interest 0  Down, Depressed, Hopeless 0  PHQ - 2 Score 0    Assessment and Plan:   Diagnoses and all orders for this visit:  Hyperlipidemia, unspecified hyperlipidemia type  Essential hypertension    . COVID-19 Education: The signs and symptoms of COVID-19 were discussed with the patient and how to seek care for testing if needed. The importance of social distancing was discussed today. . Reviewed expectations re: course of current medical issues. . Discussed self-management of symptoms. . Outlined signs and symptoms indicating need for more acute intervention. . Patient verbalized understanding and all questions were answered. Marland Kitchen Health Maintenance issues including appropriate healthy diet, exercise, and smoking avoidance were discussed with patient. . See orders for this visit as documented in the electronic medical record.  Arnette Norris,  MD  Records requested if needed. Time spent: 25 minutes, of which >50% was spent in obtaining information about his symptoms, reviewing his previous labs, evaluations, and treatments, counseling him about his condition (please see the discussed topics above), and developing a plan to further investigate it; he had a number of questions which I addressed.

## 2018-11-25 ENCOUNTER — Ambulatory Visit: Payer: 59 | Admitting: Family Medicine

## 2018-11-25 ENCOUNTER — Encounter: Payer: Self-pay | Admitting: Family Medicine

## 2018-11-25 ENCOUNTER — Telehealth (INDEPENDENT_AMBULATORY_CARE_PROVIDER_SITE_OTHER): Payer: 59 | Admitting: Family Medicine

## 2018-11-25 DIAGNOSIS — I10 Essential (primary) hypertension: Secondary | ICD-10-CM

## 2018-11-25 DIAGNOSIS — E785 Hyperlipidemia, unspecified: Secondary | ICD-10-CM | POA: Diagnosis not present

## 2018-11-25 DIAGNOSIS — F411 Generalized anxiety disorder: Secondary | ICD-10-CM | POA: Diagnosis not present

## 2018-11-25 DIAGNOSIS — G43809 Other migraine, not intractable, without status migrainosus: Secondary | ICD-10-CM | POA: Diagnosis not present

## 2018-11-25 MED ORDER — EZETIMIBE-SIMVASTATIN 10-10 MG PO TABS: ORAL_TABLET | ORAL | 3 refills | Status: DC

## 2018-11-25 MED ORDER — LISINOPRIL 10 MG PO TABS: 10.0000 mg | ORAL_TABLET | Freq: Every day | ORAL | 3 refills | Status: DC

## 2018-11-25 MED ORDER — TOPIRAMATE 50 MG PO TABS: 50.0000 mg | ORAL_TABLET | Freq: Every day | ORAL | 3 refills | Status: DC

## 2018-11-25 NOTE — Assessment & Plan Note (Signed)
Well controlled on current preventative therapy.  No changes made.  eRx refills sent.

## 2018-11-25 NOTE — Assessment & Plan Note (Signed)
He feels BP is well controlled on current dose of Lisinopril.  He will let me know what his BP is later today. BP Readings from Last 3 Encounters:  08/10/18 136/80  07/27/18 (!) 138/102  07/05/18 (!) 138/100

## 2018-11-25 NOTE — Assessment & Plan Note (Signed)
>  25 minutes spent in face to face time with patient, >50% spent in counselling or coordination of care discussing anxiety, HLD, HTN and migraines. Reviewed labs with patient as well.  eRx refill sent for zoloft 50 mg daily- he feels this is controlling his anxiety well. GAD 7 : Generalized Anxiety Score 11/25/2018 12/07/2015  Nervous, Anxious, on Edge 0 3  Control/stop worrying 0 3  Worry too much - different things 0 3  Trouble relaxing 0 1  Restless 0 1  Easily annoyed or irritable 0 3  Afraid - awful might happen 0 3  Total GAD 7 Score 0 17  Anxiety Difficulty Not difficult at all Somewhat difficult

## 2018-11-25 NOTE — Assessment & Plan Note (Signed)
Well controlled on current dose of Vytorin.  Reviewed lipid panel and liver function with pt. Encouraged increasing exercise when he can to increase his HDL.  eRx refills sent.

## 2019-01-03 ENCOUNTER — Other Ambulatory Visit: Payer: Self-pay | Admitting: Family Medicine

## 2019-01-03 DIAGNOSIS — F411 Generalized anxiety disorder: Secondary | ICD-10-CM

## 2019-06-19 ENCOUNTER — Other Ambulatory Visit: Payer: Self-pay | Admitting: Family Medicine

## 2019-06-19 DIAGNOSIS — F411 Generalized anxiety disorder: Secondary | ICD-10-CM

## 2019-06-20 NOTE — Telephone Encounter (Signed)
Last VV 11/25/18 Last fill 01/04/19 #90/1

## 2019-07-12 ENCOUNTER — Encounter: Payer: Self-pay | Admitting: Family Medicine

## 2019-09-08 ENCOUNTER — Other Ambulatory Visit: Payer: Self-pay

## 2019-09-08 DIAGNOSIS — G43809 Other migraine, not intractable, without status migrainosus: Secondary | ICD-10-CM

## 2019-09-08 DIAGNOSIS — E785 Hyperlipidemia, unspecified: Secondary | ICD-10-CM

## 2019-09-08 MED ORDER — TOPIRAMATE 50 MG PO TABS: 50.0000 mg | ORAL_TABLET | Freq: Every day | ORAL | 0 refills | Status: DC

## 2019-09-08 MED ORDER — EZETIMIBE-SIMVASTATIN 10-10 MG PO TABS: ORAL_TABLET | ORAL | 0 refills | Status: DC

## 2019-10-25 ENCOUNTER — Other Ambulatory Visit: Payer: Self-pay

## 2019-10-25 DIAGNOSIS — I10 Essential (primary) hypertension: Secondary | ICD-10-CM

## 2019-10-25 NOTE — Telephone Encounter (Signed)
Last VV 11/25/18 Last fill 11/25/18  #90/3 Pt need to follow up with a new provider.

## 2019-10-26 ENCOUNTER — Other Ambulatory Visit: Payer: Self-pay

## 2019-10-26 MED ORDER — LISINOPRIL 10 MG PO TABS: 10.0000 mg | ORAL_TABLET | Freq: Every day | ORAL | 0 refills | Status: DC

## 2019-10-27 ENCOUNTER — Encounter: Payer: Self-pay | Admitting: Family Medicine

## 2019-10-27 ENCOUNTER — Ambulatory Visit (INDEPENDENT_AMBULATORY_CARE_PROVIDER_SITE_OTHER): Payer: 59 | Admitting: Family Medicine

## 2019-10-27 VITALS — BP 118/78 | HR 69 | Temp 97.0°F | Ht 70.0 in | Wt 216.6 lb

## 2019-10-27 DIAGNOSIS — N509 Disorder of male genital organs, unspecified: Secondary | ICD-10-CM

## 2019-10-27 DIAGNOSIS — G43809 Other migraine, not intractable, without status migrainosus: Secondary | ICD-10-CM

## 2019-10-27 DIAGNOSIS — R7301 Impaired fasting glucose: Secondary | ICD-10-CM | POA: Diagnosis not present

## 2019-10-27 DIAGNOSIS — F411 Generalized anxiety disorder: Secondary | ICD-10-CM | POA: Diagnosis not present

## 2019-10-27 DIAGNOSIS — E785 Hyperlipidemia, unspecified: Secondary | ICD-10-CM

## 2019-10-27 DIAGNOSIS — I10 Essential (primary) hypertension: Secondary | ICD-10-CM

## 2019-10-27 DIAGNOSIS — Z1211 Encounter for screening for malignant neoplasm of colon: Secondary | ICD-10-CM

## 2019-10-27 DIAGNOSIS — K580 Irritable bowel syndrome with diarrhea: Secondary | ICD-10-CM

## 2019-10-27 LAB — BASIC METABOLIC PANEL
BUN: 10 mg/dL (ref 6–23)
CO2: 27 mEq/L (ref 19–32)
Calcium: 9.6 mg/dL (ref 8.4–10.5)
Chloride: 103 mEq/L (ref 96–112)
Creatinine, Ser: 0.95 mg/dL (ref 0.40–1.50)
GFR: 102.8 mL/min (ref >60.00)
Glucose, Bld: 109 mg/dL — ABNORMAL HIGH (ref 70–99)
Potassium: 3.7 mEq/L (ref 3.5–5.1)
Sodium: 139 mEq/L (ref 135–145)

## 2019-10-27 LAB — AST: AST: 26 U/L (ref 0–37)

## 2019-10-27 LAB — LIPID PANEL
Cholesterol: 189 mg/dL (ref 0–200)
HDL: 40.6 mg/dL (ref >39.00)
LDL Cholesterol: 125 mg/dL — ABNORMAL HIGH (ref 0–99)
NonHDL: 148.57
Total CHOL/HDL Ratio: 5
Triglycerides: 119 mg/dL (ref 0.0–149.0)
VLDL: 23.8 mg/dL (ref 0.0–40.0)

## 2019-10-27 LAB — ALT: ALT: 34 U/L (ref 0–53)

## 2019-10-27 LAB — HEMOGLOBIN A1C: Hgb A1c MFr Bld: 6.4 % (ref 4.6–6.5)

## 2019-10-27 MED ORDER — HYOSCYAMINE SULFATE SL 0.125 MG SL SUBL: 0.1250 mg | SUBLINGUAL_TABLET | Freq: Three times a day (TID) | SUBLINGUAL | 1 refills | Status: DC | PRN

## 2019-10-27 MED ORDER — AMITRIPTYLINE HCL 25 MG PO TABS: 25.0000 mg | ORAL_TABLET | Freq: Every day | ORAL | 1 refills | Status: DC

## 2019-10-27 NOTE — Progress Notes (Signed)
Micheal Keller is a 49 y.o. male  Chief Complaint  Patient presents with  . Establish Care    TOC from Dr. Arnetha Massy refills-already filled but needed blood work//pt is fasting//    HPI: Micheal Keller is a 49 y.o. male here for [facility] previous PCP Dr. Deborra Medina. Pt is here for f/u on HTN, hyperlipidemia, migraines.  Migraines are well-controlled.  No issues with BP - controlled/at goal at home when he sporadically checks.  Pt due for colonoscopy. He complains of 1 year h/o abdominal cramping, alternation between mostly diarrhea and at times constipation.  No blood in stool. No fever, chills. Appetite is good. Feels gassy and bloated at times. + anxiety, stress  8-63mo h/o "mole" on Lt scrotum. Area itself gets irritated with movement/rubbing against leg but otherwise asymptotic. No change in size. At times, the top of area "falls off" but then comes back.    Past Medical History:  Diagnosis Date  . Hemorrhoids   . Hyperlipidemia   . Hypertension   . Migraines     Past Surgical History:  Procedure Laterality Date  . HEMORRHOID BANDING      Social History   Socioeconomic History  . Marital status: Married    Spouse name: Not on file  . Number of children: Not on file  . Years of education: Not on file  . Highest education level: Not on file  Occupational History  . Not on file  Tobacco Use  . Smoking status: Former Research scientist (life sciences)  . Smokeless tobacco: Never Used  Substance and Sexual Activity  . Alcohol use: Yes    Alcohol/week: 0.0 standard drinks    Comment: couple drinks a week   . Drug use: No  . Sexual activity: Not on file  Other Topics Concern  . Not on file  Social History Narrative  . Not on file   Social Determinants of Health   Financial Resource Strain:   . Difficulty of Paying Living Expenses:   Food Insecurity:   . Worried About Charity fundraiser in the Last Year:   . Arboriculturist in the Last Year:   Transportation Needs:   . Film/video editor  (Medical):   Marland Kitchen Lack of Transportation (Non-Medical):   Physical Activity:   . Days of Exercise per Week:   . Minutes of Exercise per Session:   Stress:   . Feeling of Stress :   Social Connections:   . Frequency of Communication with Friends and Family:   . Frequency of Social Gatherings with Friends and Family:   . Attends Religious Services:   . Active Member of Clubs or Organizations:   . Attends Archivist Meetings:   Marland Kitchen Marital Status:   Intimate Partner Violence:   . Fear of Current or Ex-Partner:   . Emotionally Abused:   Marland Kitchen Physically Abused:   . Sexually Abused:     Family History  Problem Relation Age of Onset  . Diabetes Mother   . Hypertension Mother   . Hyperlipidemia Father   . [name] Parkinson White syndrome Brother   . Bone cancer Sister      Immunization History  Administered Date(s) Administered  . Influenza Inj Mdck Quad Pf 04/28/2017  . Influenza,inj,Quad PF,6+ Mos 05/12/2014, 04/17/2018  . Influenza-Unspecified 03/21/2016  . PFIZER SARS-COV-2 Vaccination 07/30/2019, 08/20/2019    Outpatient Encounter Medications as of 10/27/2019  Medication Sig  . cetirizine (ZYRTEC) 10 MG tablet Take 10 mg by mouth daily.  Marland Kitchen  DYMISTA 137-50 MCG/ACT SUSP   . eletriptan (RELPAX) 40 MG tablet TAKE 1 TABLET AS NEEDED FORMIGRAINE MAY REPEAT IN 2   HOURS IF NECESSARY  . ezetimibe-simvastatin (VYTORIN) 10-10 MG tablet TAKE 1 TABLET BY MOUTH ONCE DAILY  . lisinopril (ZESTRIL) 10 MG tablet Take 1 tablet (10 mg total) by mouth daily. Needs appt with pcp for additional refills  . promethazine (PHENERGAN) 25 MG tablet Take 25 mg by mouth every 6 (six) hours as needed for nausea.  . promethazine-dextromethorphan (PROMETHAZINE-DM) 6.25-15 MG/5ML syrup Take 5 mLs by mouth 4 (four) times daily as needed for cough.  . sertraline (ZOLOFT) 50 MG tablet TAKE 1 TABLET BY MOUTH  DAILY  . topiramate (TOPAMAX) 50 MG tablet Take 1 tablet (50 mg total) by mouth at bedtime.   No  facility-administered encounter medications on file as of 10/27/2019.     ROS: Pertinent positives and negatives noted in HPI. Remainder of ROS non-contributory    No Known Allergies  BP 118/78   Pulse 69   Temp (!) 97 F (36.1 C) (Tympanic)   Ht 5\' 10"  (1.778 m)   Wt 216 lb 9.6 oz (98.2 kg)   SpO2 100%   BMI 31.08 kg/m   Physical Exam  Constitutional: He is oriented to person, place, and time. He appears well-developed and well-nourished. No distress.  Cardiovascular: Normal rate and regular rhythm.  Pulmonary/Chest: No respiratory distress.  Genitourinary:     Musculoskeletal:        General: No edema.  Neurological: He is alert and oriented to person, place, and time. Coordination normal.  Psychiatric: He has a normal mood and affect. His behavior is normal.     A/P:  1. Essential hypertension - controlled, at goal - cont current med - Basic metabolic panel - low sodium diet, regular CV exercise  2. Hyperlipidemia, unspecified hyperlipidemia type - cont current med at this time - Lipid panel - AST - ALT  3. GAD (generalized anxiety disorder) - controlled, cont current meds  4. Other migraine without status migrainosus, not intractable - stable, very well-controlled - cont current meds  5. Elevated fasting glucose - Basic metabolic panel - Hemoglobin A1c  6. Screening for colon cancer - Ambulatory referral to Gastroenterology  7. Irritable bowel syndrome with diarrhea Rx: - Hyoscyamine Sulfate SL (LEVSIN/SL) 0.125 MG SUBL; Place 0.125 mg under the tongue every 8 (eight) hours as needed.  Dispense: 90 tablet; Refill: 1 - amitriptyline (ELAVIL) 25 MG tablet; Take 1 tablet (25 mg total) by mouth at bedtime.  Dispense: 90 tablet; Refill: 1  8. Scrotal skin lesion - Ambulatory referral to Dermatology    This visit occurred during the SARS-CoV-2 public health emergency.  Safety protocols were in place, including screening questions prior to the  visit, additional usage of staff PPE, and extensive cleaning of exam room while observing appropriate contact time as indicated for disinfecting solutions.

## 2019-11-02 ENCOUNTER — Other Ambulatory Visit: Payer: Self-pay

## 2019-11-02 DIAGNOSIS — I10 Essential (primary) hypertension: Secondary | ICD-10-CM

## 2019-11-02 MED ORDER — LISINOPRIL 10 MG PO TABS: 10.0000 mg | ORAL_TABLET | Freq: Every day | ORAL | 1 refills | Status: DC

## 2019-11-03 ENCOUNTER — Encounter: Payer: Self-pay | Admitting: Family Medicine

## 2019-11-17 ENCOUNTER — Telehealth (INDEPENDENT_AMBULATORY_CARE_PROVIDER_SITE_OTHER): Payer: 59 | Admitting: Family Medicine

## 2019-11-17 ENCOUNTER — Encounter: Payer: Self-pay | Admitting: Family Medicine

## 2019-11-17 VITALS — Ht 70.0 in

## 2019-11-17 DIAGNOSIS — K580 Irritable bowel syndrome with diarrhea: Secondary | ICD-10-CM

## 2019-11-17 NOTE — Progress Notes (Signed)
Virtual Visit via Video Note  I connected with Micheal Keller on 11/17/19 at 11:30 AM EDT by a video enabled telemedicine application and verified that I am speaking with the correct person using two identifiers. Location patient: home Location provider: work  Persons participating in the virtual visit: patient, provider  I discussed the limitations of evaluation and management by telemedicine and the availability of in person appointments. The patient expressed understanding and agreed to proceed.  Chief Complaint  Patient presents with  . Follow-up    Medication follow up     HPI: Takeo Okereke is a 49 y.o. male seen today in f/u for IBS w/ diarrhea. He was seen by me on 4/22 and was stated on levsin and amitriptyline 25mg  qHS.  Pt states he is doing well and "things have settled down". He has only had to take levsin x 1 dose. He is sleeping better. No side effects. Less cramping, diarrhea, bloating. Appetite is good. Overall much improved. He would like to continue meds.   Past Medical History:  Diagnosis Date  . Hemorrhoids   . Hyperlipidemia   . Hypertension   . Migraines     Past Surgical History:  Procedure Laterality Date  . HEMORRHOID BANDING      Family History  Problem Relation Age of Onset  . Diabetes Mother   . Hypertension Mother   . Hyperlipidemia Father   . [name] Parkinson White syndrome Brother   . Bone cancer Sister     Social History   Tobacco Use  . Smoking status: Former Research scientist (life sciences)  . Smokeless tobacco: Never Used  Substance Use Topics  . Alcohol use: Yes    Alcohol/week: 0.0 standard drinks    Comment: couple drinks a week   . Drug use: No     Current Outpatient Medications:  .  amitriptyline (ELAVIL) 25 MG tablet, Take 1 tablet (25 mg total) by mouth at bedtime., Disp: 90 tablet, Rfl: 1 .  cetirizine (ZYRTEC) 10 MG tablet, Take 10 mg by mouth daily., Disp: , Rfl:  .  DYMISTA 137-50 MCG/ACT SUSP, , Disp: , Rfl:  .  eletriptan (RELPAX) 40 MG tablet,  TAKE 1 TABLET AS NEEDED FORMIGRAINE MAY REPEAT IN 2   HOURS IF NECESSARY, Disp: 6 tablet, Rfl: 2 .  ezetimibe-simvastatin (VYTORIN) 10-10 MG tablet, TAKE 1 TABLET BY MOUTH ONCE DAILY, Disp: 90 tablet, Rfl: 0 .  Hyoscyamine Sulfate SL (LEVSIN/SL) 0.125 MG SUBL, Place 0.125 mg under the tongue every 8 (eight) hours as needed., Disp: 90 tablet, Rfl: 1 .  lisinopril (ZESTRIL) 10 MG tablet, Take 1 tablet (10 mg total) by mouth daily., Disp: 90 tablet, Rfl: 1 .  promethazine (PHENERGAN) 25 MG tablet, Take 25 mg by mouth every 6 (six) hours as needed for nausea., Disp: , Rfl:  .  sertraline (ZOLOFT) 50 MG tablet, TAKE 1 TABLET BY MOUTH  DAILY, Disp: 90 tablet, Rfl: 3 .  topiramate (TOPAMAX) 50 MG tablet, Take 1 tablet (50 mg total) by mouth at bedtime., Disp: 90 tablet, Rfl: 0 .  promethazine-dextromethorphan (PROMETHAZINE-DM) 6.25-15 MG/5ML syrup, Take 5 mLs by mouth 4 (four) times daily as needed for cough., Disp: 118 mL, Rfl: 0  No Known Allergies    ROS: See pertinent positives and negatives per HPI.   EXAM:  VITALS per patient if applicable: Ht 5\' 10"  (1.778 m)   BMI 31.08 kg/m    GENERAL: alert, oriented, appears well and in no acute distress  LUNGS: no obvious gross SOB,  gasping or wheezing, no conversational dyspnea  CV: no obvious cyanosis  PSYCH/NEURO: pleasant and cooperative, speech and thought processing grossly intact   ASSESSMENT AND PLAN: 1. Irritable bowel syndrome with diarrhea - much-improved on amitriptyline 25mg  qHS - pt has Rx for PRN levsin but has only had to take 1 dose in past 3+ wks - cont current med, diet - f/u PRN   I discussed the assessment and treatment plan with the patient. The patient was provided an opportunity to ask questions and all were answered. The patient agreed with the plan and demonstrated an understanding of the instructions.   The patient was advised to call back or seek an in-person evaluation if the symptoms worsen or if the  condition fails to improve as anticipated.   Letta Median, DO

## 2019-11-23 ENCOUNTER — Encounter: Payer: Self-pay | Admitting: Internal Medicine

## 2019-11-27 ENCOUNTER — Other Ambulatory Visit: Payer: Self-pay | Admitting: Family Medicine

## 2019-11-27 DIAGNOSIS — E785 Hyperlipidemia, unspecified: Secondary | ICD-10-CM

## 2019-11-27 DIAGNOSIS — G43809 Other migraine, not intractable, without status migrainosus: Secondary | ICD-10-CM

## 2019-12-20 ENCOUNTER — Other Ambulatory Visit: Payer: Self-pay

## 2019-12-20 DIAGNOSIS — G43809 Other migraine, not intractable, without status migrainosus: Secondary | ICD-10-CM

## 2019-12-20 MED ORDER — TOPIRAMATE 50 MG PO TABS: 50.0000 mg | ORAL_TABLET | Freq: Every day | ORAL | 1 refills | Status: DC

## 2019-12-23 ENCOUNTER — Encounter: Payer: Self-pay | Admitting: Internal Medicine

## 2019-12-23 ENCOUNTER — Other Ambulatory Visit: Payer: Self-pay

## 2019-12-23 ENCOUNTER — Ambulatory Visit (AMBULATORY_SURGERY_CENTER): Payer: Self-pay | Admitting: *Deleted

## 2019-12-23 VITALS — Ht 70.0 in | Wt 214.8 lb

## 2019-12-23 DIAGNOSIS — Z1211 Encounter for screening for malignant neoplasm of colon: Secondary | ICD-10-CM

## 2019-12-23 NOTE — Progress Notes (Signed)
2nd dose of covid vaccine 08-20-19  Pt is aware that care partner will wait in the car during procedure; if they feel like they will be too hot or cold to wait in the car; they may wait in the 4 th floor lobby. Patient is aware to bring only one care partner. We want them to wear a mask (we do not have any that we can provide them), practice social distancing, and we will check their temperatures when they get here.  I did remind the patient that their care partner needs to stay in the parking lot the entire time and have a cell phone available, we will call them when the pt is ready for discharge. Patient will wear mask into building.   No egg or soy allergy  No home oxygen use   No medications for weight loss taken  emmi information given  Pt denies constipation issues  No trouble with anesthesia for dental work, no trouble moving neck and no fam hx malignant hyperthermia  If you use chewing tobacco or snuff, please DO NOT use this the day of the procedure. If you do, your procedure will be cancelled !!- smart phrase put into instructions

## 2019-12-28 ENCOUNTER — Ambulatory Visit: Payer: 59 | Admitting: Physician Assistant

## 2020-01-06 ENCOUNTER — Encounter: Payer: Self-pay | Admitting: Internal Medicine

## 2020-01-06 ENCOUNTER — Other Ambulatory Visit: Payer: Self-pay

## 2020-01-06 ENCOUNTER — Ambulatory Visit (AMBULATORY_SURGERY_CENTER): Payer: 59 | Admitting: Internal Medicine

## 2020-01-06 VITALS — BP 101/73 | HR 53 | Temp 96.8°F | Resp 18 | Ht 70.0 in | Wt 214.0 lb

## 2020-01-06 DIAGNOSIS — D125 Benign neoplasm of sigmoid colon: Secondary | ICD-10-CM

## 2020-01-06 DIAGNOSIS — D123 Benign neoplasm of transverse colon: Secondary | ICD-10-CM

## 2020-01-06 DIAGNOSIS — Z1211 Encounter for screening for malignant neoplasm of colon: Secondary | ICD-10-CM

## 2020-01-06 MED ORDER — SODIUM CHLORIDE 0.9 % IV SOLN: 500.0000 mL | INTRAVENOUS | Status: DC

## 2020-01-06 NOTE — Op Note (Signed)
[place] Patient Name: Micheal Keller Procedure Date: 01/06/2020 12:01 PM MRN: [id number] Endoscopist: Gatha Mayer , MD Age: 49 Referring MD:  Date of Birth: 28-Dec-1970 Gender: Male Account #: [id number] Procedure:                Colonoscopy Indications:              Screening for colorectal malignant neoplasm, This                            is the patient's first colonoscopy Medicines:                Propofol per Anesthesia, Monitored Anesthesia Care Procedure:                Pre-Anesthesia Assessment:                           - Prior to the procedure, a History and Physical                            was performed, and patient medications and                            allergies were reviewed. The patient's tolerance of                            previous anesthesia was also reviewed. The risks                            and benefits of the procedure and the sedation                            options and risks were discussed with the patient.                            All questions were answered, and informed consent                            was obtained. Prior Anticoagulants: The patient has                            taken no previous anticoagulant or antiplatelet                            agents. ASA Grade Assessment: II - A patient with                            mild systemic disease. After reviewing the risks                            and benefits, the patient was deemed in                            satisfactory condition to undergo the procedure.  After obtaining informed consent, the colonoscope                            was passed under direct vision. Throughout the                            procedure, the patient's blood pressure, pulse, and                            oxygen saturations were monitored continuously. The                            Colonoscope was introduced through the anus and                            advanced  to the the cecum, identified by                            appendiceal orifice and ileocecal valve. The                            colonoscopy was performed without difficulty. The                            patient tolerated the procedure well. The quality                            of the bowel preparation was good. The ileocecal                            valve, appendiceal orifice, and rectum were                            photographed. The bowel preparation used was                            Miralax via split dose instruction. Scope In: 12:12:24 PM Scope Out: 12:29:15 PM Scope Withdrawal Time: 0 hours 11 minutes 36 seconds  Total Procedure Duration: 0 hours 16 minutes 51 seconds  Findings:                 The perianal and digital rectal examinations were                            normal. Pertinent negatives include normal prostate                            (size, shape, and consistency).                           Two sessile polyps were found in the sigmoid colon                            and cecum. The polyps were 1 mm in size. These  polyps were removed with a cold biopsy forceps.                            Resection and retrieval were complete. Verification                            of patient identification for the specimen was                            done. Estimated blood loss was minimal.                           The exam was otherwise without abnormality on                            direct and retroflexion views. Complications:            No immediate complications. Estimated Blood Loss:     Estimated blood loss was minimal. Impression:               - Two 1 mm polyps in the sigmoid colon and in the                            cecum, removed with a cold biopsy forceps. Resected                            and retrieved.                           - The examination was otherwise normal on direct                            and retroflexion  views. Did see prior hemorrhoid                            banding scars Recommendation:           - Patient has a contact number available for                            emergencies. The signs and symptoms of potential                            delayed complications were discussed with the                            patient. Return to normal activities tomorrow.                            Written discharge instructions were provided to the                            patient.                           - Resume previous diet.                           -  Continue present medications.                           - Repeat colonoscopy is recommended. The                            colonoscopy date will be determined after pathology                            results from today's exam become available for                            review. Gatha Mayer, MD 01/06/2020 12:43:41 PM This report has been signed electronically.

## 2020-01-06 NOTE — Progress Notes (Signed)
Called to room to assist during endoscopic procedure.  Patient ID and intended procedure confirmed with present staff. Received instructions for my participation in the procedure from the performing physician.  

## 2020-01-06 NOTE — Progress Notes (Signed)
Vs CW I have reviewed the patient's medical history in detail and updated the computerized patient record.   

## 2020-01-06 NOTE — Progress Notes (Signed)
pt tolerated well. VSS. awake and to recovery. Report given to RN.  

## 2020-01-06 NOTE — Patient Instructions (Addendum)
I found and removed 2 tiny polyps today. I will let you know pathology results and when to have another routine colonoscopy by mail and/or My Chart.   I appreciate the opportunity to care for you. Gatha Mayer, MD, FACG  YOU HAD AN ENDOSCOPIC PROCEDURE TODAY AT [facility]:   Refer to the procedure report that was given to you for any specific questions about what was found during the examination.  If the procedure report does not answer your questions, please call your gastroenterologist to clarify.  If you requested that your care partner not be given the details of your procedure findings, then the procedure report has been included in a sealed envelope for you to review at your convenience later.  YOU SHOULD EXPECT: Some feelings of bloating in the abdomen. Passage of more gas than usual.  Walking can help get rid of the air that was put into your GI tract during the procedure and reduce the bloating. If you had a lower endoscopy (such as a colonoscopy or flexible sigmoidoscopy) you may notice spotting of blood in your stool or on the toilet paper. If you underwent a bowel prep for your procedure, you may not have a normal bowel movement for a few days.  Please Note:  You might notice some irritation and congestion in your nose or some drainage.  This is from the oxygen used during your procedure.  There is no need for concern and it should clear up in a day or so.  SYMPTOMS TO REPORT IMMEDIATELY:   Following lower endoscopy (colonoscopy or flexible sigmoidoscopy):  Excessive amounts of blood in the stool  Significant tenderness or worsening of abdominal pains  Swelling of the abdomen that is new, acute  Fever of 100F or higher  For urgent or emergent issues, a gastroenterologist can be reached at any hour by calling [phone]. Do not use MyChart messaging for urgent concerns.    DIET:  We do recommend a small meal at first, but then you may proceed to your  regular diet.  Drink plenty of fluids but you should avoid alcoholic beverages for 24 hours.  ACTIVITY:  You should plan to take it easy for the rest of today and you should NOT DRIVE or use heavy machinery until tomorrow (because of the sedation medicines used during the test).    FOLLOW UP: Our staff will call the number listed on your records 48-72 hours following your procedure to check on you and address any questions or concerns that you may have regarding the information given to you following your procedure. If we do not reach you, we will leave a message.  We will attempt to reach you two times.  During this call, we will ask if you have developed any symptoms of COVID 19. If you develop any symptoms (ie: fever, flu-like symptoms, shortness of breath, cough etc.) before then, please call [phone].  If you test positive for Covid 19 in the 2 weeks post procedure, please call and report this information to [place].    If any biopsies were taken you will be contacted by phone or by letter within the next 1-3 weeks.  Please call us at [phone] if you have not heard about the biopsies in 3 weeks.    SIGNATURES/CONFIDENTIALITY: You and/or your care partner have signed paperwork which will be entered into your electronic medical record.  These signatures attest to the fact that that the information above on your After  Visit Summary has been reviewed and is understood.  Full responsibility of the confidentiality of this discharge information lies with you and/or your care-partner. 

## 2020-01-11 ENCOUNTER — Telehealth: Payer: Self-pay | Admitting: *Deleted

## 2020-01-11 NOTE — Telephone Encounter (Signed)
  Follow up Call-  Call back number 01/06/2020  Post procedure Call Back phone  # [phone]  Permission to leave phone message Yes  Some recent data might be hidden    First attempt for follow up phone call. No answer at number given.  Left message on voicemail.

## 2020-01-11 NOTE — Telephone Encounter (Signed)
  Follow up Call-  Call back number 01/06/2020  Post procedure Call Back phone  # [phone]  Permission to leave phone message Yes  Some recent data might be hidden     Patient questions:  Do you have a fever, pain , or abdominal swelling? No. Pain Score  0 *  Have you tolerated food without any problems? Yes.    Have you been able to return to your normal activities? Yes.    Do you have any questions about your discharge instructions: Diet   No. Medications  No. Follow up visit  No.  Do you have questions or concerns about your Care? No.  Actions: * If pain score is 4 or above: No action needed, pain <4.  1. Have you developed a fever since your procedure? No  2.   Have you had an respiratory symptoms (SOB or cough) since your procedure? no  3.   Have you tested positive for COVID 19 since your procedure no  4.   Have you had any family members/close contacts diagnosed with the COVID 19 since your procedure?  no   If yes to any of these questions please route to Joylene John, RN and Erenest Rasher, RN

## 2020-01-13 ENCOUNTER — Encounter: Payer: Self-pay | Admitting: Internal Medicine

## 2020-01-13 DIAGNOSIS — Z860100 Personal history of colon polyps, unspecified: Secondary | ICD-10-CM

## 2020-01-13 HISTORY — DX: Personal history of colonic polyps: Z86.010

## 2020-01-13 HISTORY — DX: Personal history of colon polyps, unspecified: Z86.0100

## 2020-01-24 ENCOUNTER — Encounter: Payer: Self-pay | Admitting: Family Medicine

## 2020-01-24 DIAGNOSIS — I10 Essential (primary) hypertension: Secondary | ICD-10-CM

## 2020-01-24 MED ORDER — LISINOPRIL 10 MG PO TABS: 10.0000 mg | ORAL_TABLET | Freq: Every day | ORAL | 1 refills | Status: DC

## 2020-01-24 NOTE — Telephone Encounter (Signed)
Last OV 10/27/19 Last fill 11/02/19  #90/1

## 2020-02-03 IMAGING — DX DG CHEST 2V
2 series · 2 of 2 positions shown · non-contrast
Comparison: 09/14/2017

CLINICAL DATA: History of lingular pneumonia

EXAM:
CHEST - 2 VIEW

[chest pa]
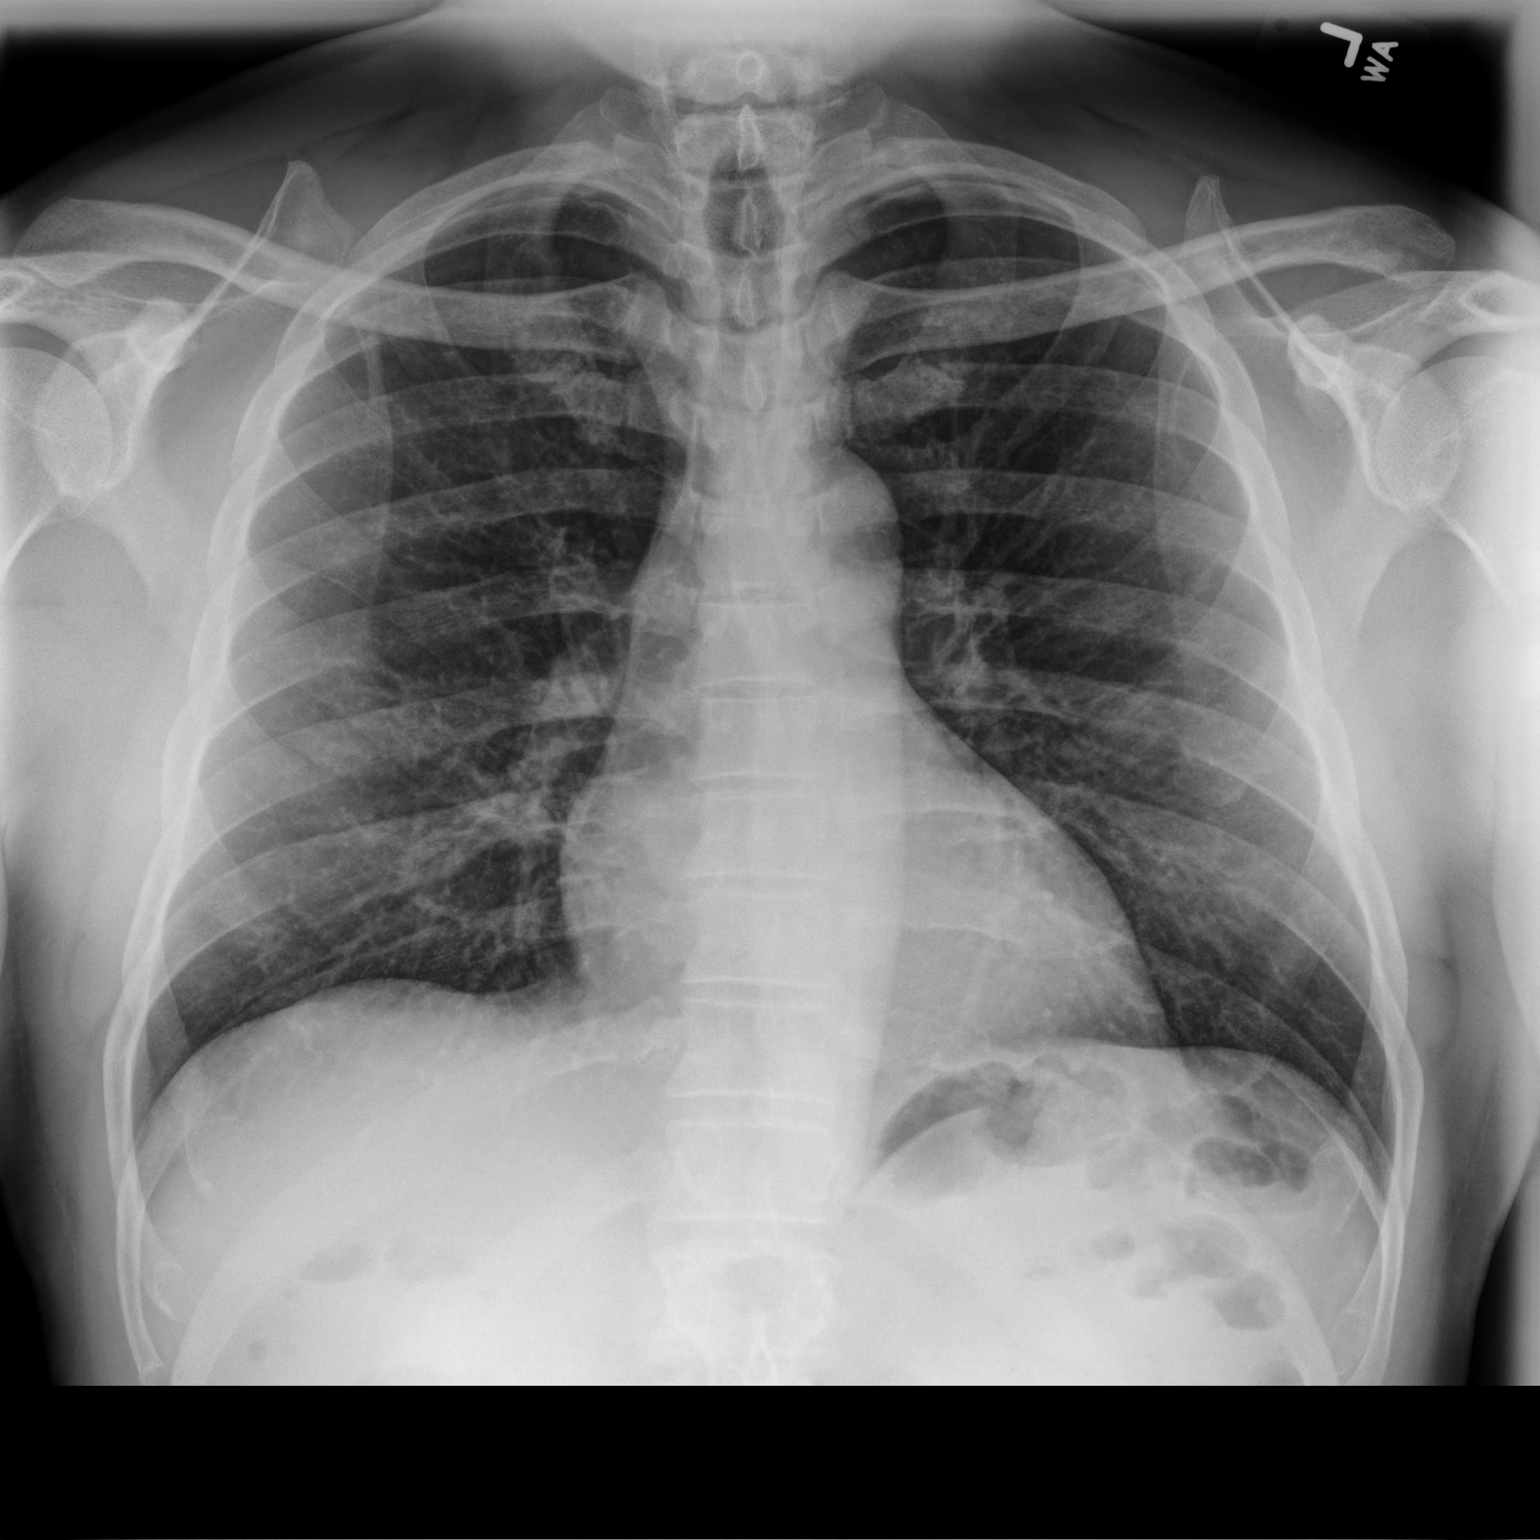

[chest lat]
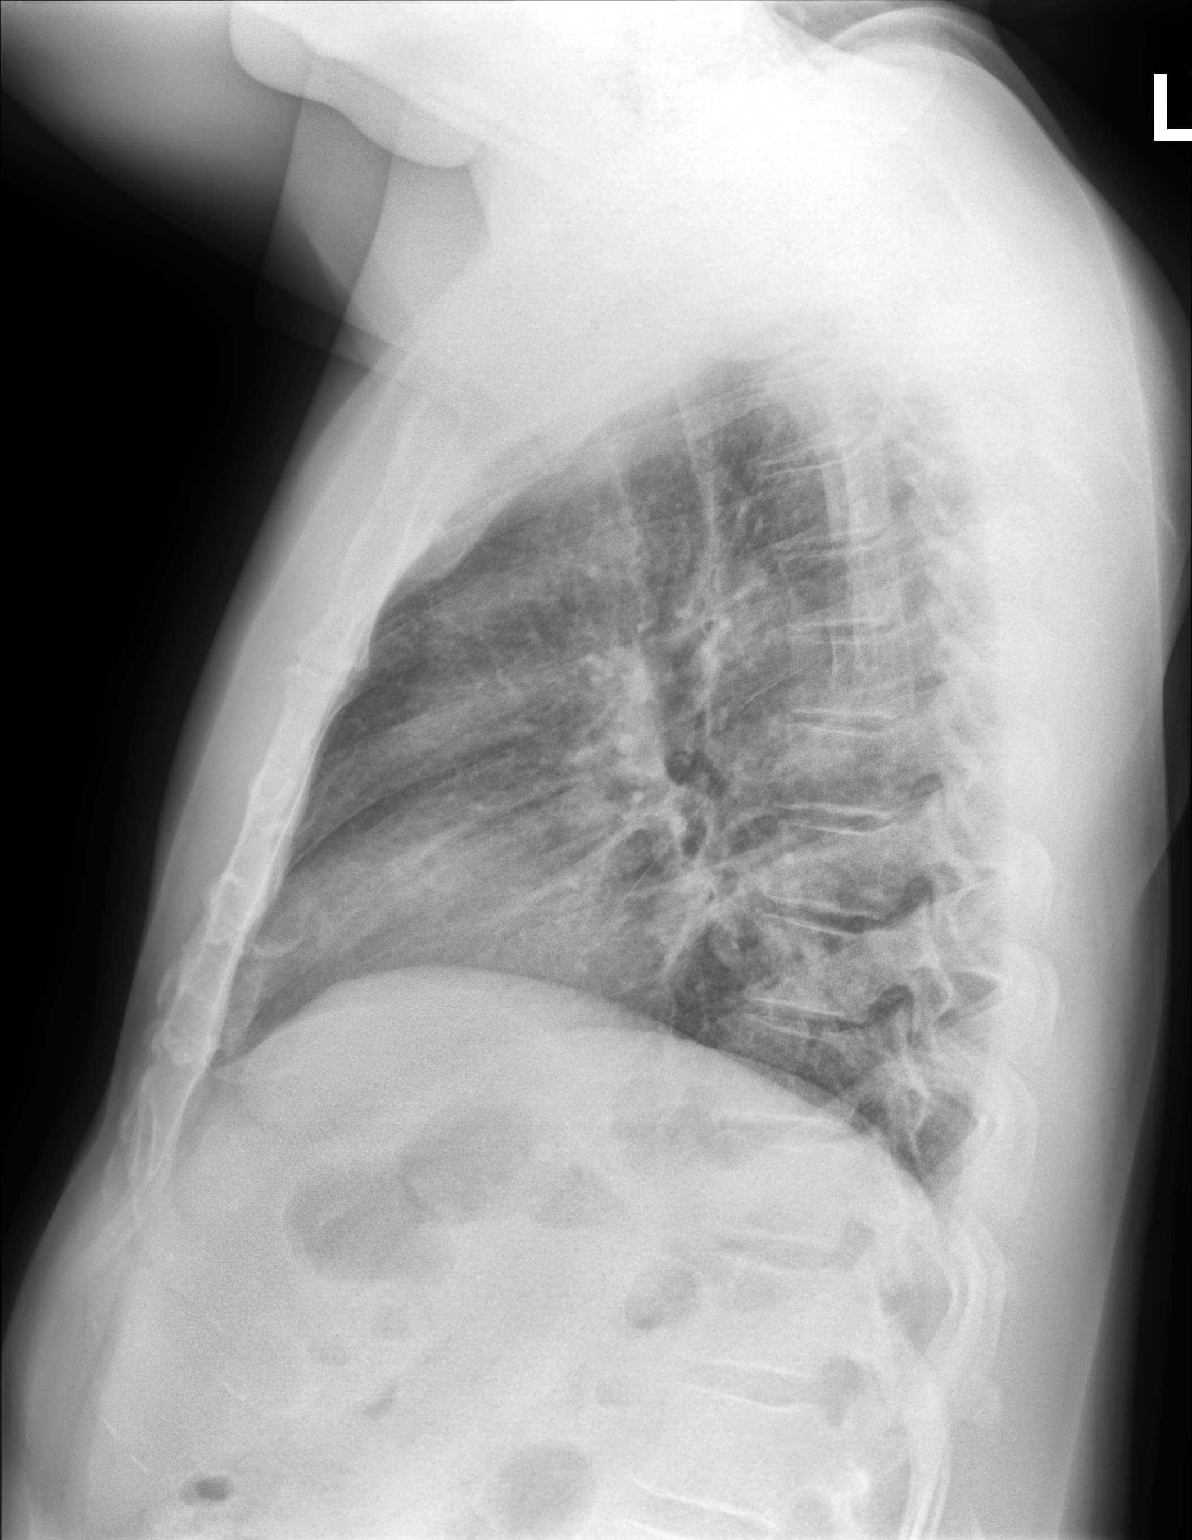

[2 of 2 positions shown; findings below may reference images not displayed]

FINDINGS: The heart size and mediastinal contours are within normal limits.
Both lungs are clear. The visualized skeletal structures are
unremarkable.
IMPRESSION: No acute abnormality noted. Previously seen lingular pneumonia has
resolved in the interval.

## 2020-02-08 ENCOUNTER — Other Ambulatory Visit: Payer: Self-pay

## 2020-02-08 ENCOUNTER — Encounter: Payer: Self-pay | Admitting: Physician Assistant

## 2020-02-08 ENCOUNTER — Ambulatory Visit: Payer: 59 | Admitting: Physician Assistant

## 2020-02-08 DIAGNOSIS — D485 Neoplasm of uncertain behavior of skin: Secondary | ICD-10-CM | POA: Diagnosis not present

## 2020-02-08 NOTE — Progress Notes (Signed)
   New Patient Visit  Subjective  Micheal Keller is a 49 y.o. male who presents for the following: Skin Problem (Check left side on testical. It is irritating to the patient. x 1 year. His primary doctor said it wasnt an in grown hair and to see derm, Does get flakey and peel off. ). This area grows and sloughs off. When it gets larger it does rub against his leg and gets sore. It never totally clears. It has not bled. His PCP was not sure what it was. It gets white at times and looks like a dead layer of skin.   Objective  Well appearing patient in no apparent distress; mood and affect are within normal limits.  Scrotum was examined.  Objective  Left Scrotum : Warty papule     Assessment & Plan  Neoplasm of uncertain behavior of skin Left Scrotum   Skin / nail biopsy Type of biopsy: tangential   Informed consent: discussed and consent obtained   Timeout: patient name, date of birth, surgical site, and procedure verified   Procedure prep:  Patient was prepped and draped in usual sterile fashion (Non sterile) Prep type:  Chlorhexidine Anesthesia: the lesion was anesthetized in a standard fashion   Anesthetic:  1% lidocaine plain local infiltration Instrument used: flexible razor blade   Hemostasis achieved with: aluminum chloride   Outcome: patient tolerated procedure well   Post-procedure details: wound care instructions given    Specimen 1 - Surgical pathology Differential Diagnosis: wart Check Margins: No

## 2020-02-08 NOTE — Patient Instructions (Signed)

## 2020-04-15 ENCOUNTER — Other Ambulatory Visit: Payer: Self-pay | Admitting: Family Medicine

## 2020-04-15 DIAGNOSIS — K580 Irritable bowel syndrome with diarrhea: Secondary | ICD-10-CM

## 2020-04-18 ENCOUNTER — Other Ambulatory Visit: Payer: Self-pay

## 2020-04-18 ENCOUNTER — Encounter: Payer: Self-pay | Admitting: Family Medicine

## 2020-04-18 DIAGNOSIS — K580 Irritable bowel syndrome with diarrhea: Secondary | ICD-10-CM

## 2020-04-18 MED ORDER — AMITRIPTYLINE HCL 25 MG PO TABS: ORAL_TABLET | ORAL | 1 refills | Status: DC

## 2020-05-04 ENCOUNTER — Other Ambulatory Visit: Payer: Self-pay | Admitting: Family Medicine

## 2020-05-04 DIAGNOSIS — G43809 Other migraine, not intractable, without status migrainosus: Secondary | ICD-10-CM

## 2020-05-16 ENCOUNTER — Telehealth: Payer: Self-pay

## 2020-05-16 DIAGNOSIS — F411 Generalized anxiety disorder: Secondary | ICD-10-CM

## 2020-05-16 MED ORDER — SERTRALINE HCL 50 MG PO TABS: 50.0000 mg | ORAL_TABLET | Freq: Every day | ORAL | 3 refills | Status: DC

## 2020-05-16 NOTE — Addendum Note (Signed)
Addended by: Ronnald Nian on: 05/16/2020 10:24 AM   Modules accepted: Orders

## 2020-05-16 NOTE — Telephone Encounter (Signed)
Received a refill request for:  Sertraline HCL 50 mg LR 06/20/19, #90, 3 rf LOV 11/17/19 FOV none scheduled.    Please review and advise.  Thanks.  Dm/cma

## 2020-05-16 NOTE — Telephone Encounter (Signed)
Refill sent.

## 2020-06-03 ENCOUNTER — Encounter: Payer: Self-pay | Admitting: Family Medicine

## 2020-06-18 ENCOUNTER — Other Ambulatory Visit: Payer: Self-pay | Admitting: Family Medicine

## 2020-06-18 DIAGNOSIS — I10 Essential (primary) hypertension: Secondary | ICD-10-CM

## 2020-07-25 ENCOUNTER — Other Ambulatory Visit: Payer: Self-pay | Admitting: Family Medicine

## 2020-07-25 DIAGNOSIS — G43809 Other migraine, not intractable, without status migrainosus: Secondary | ICD-10-CM

## 2020-07-26 NOTE — Telephone Encounter (Signed)
Refill request for  Topirimater 50 mg LR 05/07/20, #90, o rf LOV 11/17/19 FOV none scheduled.  Please advise.   Thanks.  Dm/cma

## 2020-09-09 ENCOUNTER — Other Ambulatory Visit: Payer: Self-pay | Admitting: Family Medicine

## 2020-09-09 DIAGNOSIS — I10 Essential (primary) hypertension: Secondary | ICD-10-CM

## 2020-09-10 NOTE — Telephone Encounter (Signed)
Last vv 11/17/19 Last fill 06/19/20  #90/0

## 2020-09-16 ENCOUNTER — Other Ambulatory Visit: Payer: Self-pay | Admitting: Family Medicine

## 2020-09-16 DIAGNOSIS — K580 Irritable bowel syndrome with diarrhea: Secondary | ICD-10-CM

## 2020-10-11 ENCOUNTER — Other Ambulatory Visit: Payer: Self-pay

## 2020-10-11 MED ORDER — ELETRIPTAN HYDROBROMIDE 40 MG PO TABS: ORAL_TABLET | ORAL | 2 refills | Status: DC

## 2020-10-11 NOTE — Telephone Encounter (Signed)
Last VV 11/17/19 Last fill 11/18/16  #6/2

## 2020-10-25 ENCOUNTER — Other Ambulatory Visit: Payer: Self-pay | Admitting: Family Medicine

## 2020-10-25 DIAGNOSIS — E785 Hyperlipidemia, unspecified: Secondary | ICD-10-CM

## 2020-10-26 NOTE — Telephone Encounter (Signed)
Left a detailed message on voicemail, informing pt to call office to schedule.

## 2020-10-26 NOTE — Telephone Encounter (Signed)
90 day refill sent to pharm but please schedule pt for HTN, HLD f/u as pt has not been seen for these chronic medical issues since 10/2019

## 2020-10-31 IMAGING — DX DG FINGER MIDDLE 2+V*L*
3 series · 3 of 3 positions shown · non-contrast
Comparison: No prior.

CLINICAL DATA: Injured finger.

EXAM:
LEFT MIDDLE FINGER 2+V

[finger pa]
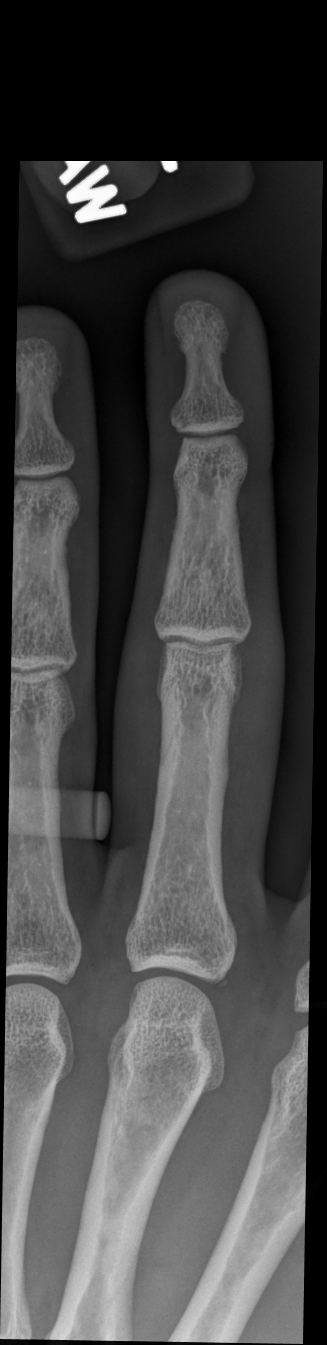

[finger mlo]
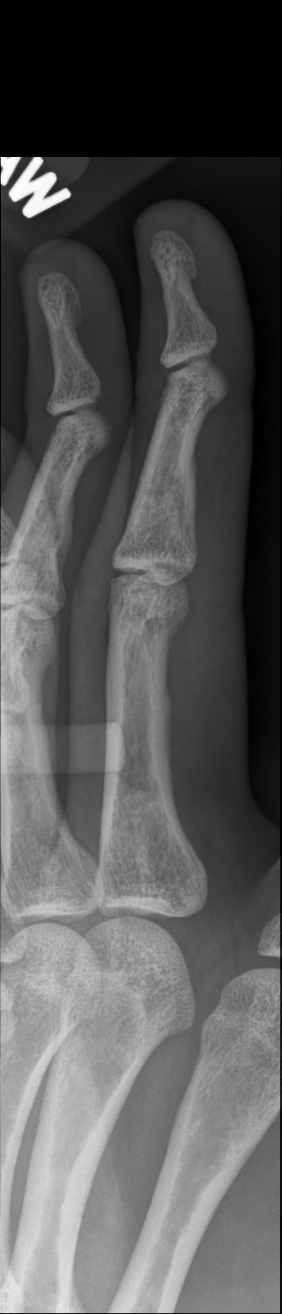

[finger lat]
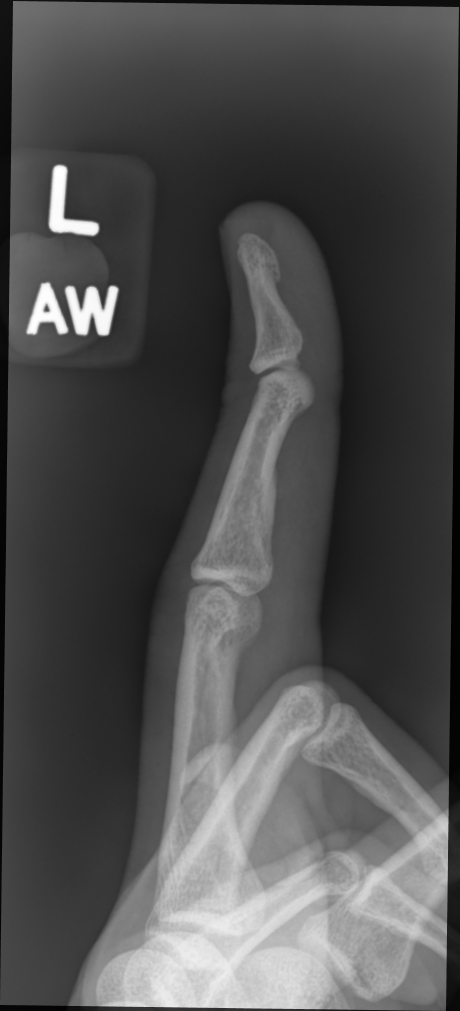

[3 of 3 positions shown; findings below may reference images not displayed]

FINDINGS: Soft tissue swelling. Tiny bony density noted at the base of the
proximal phalanx of the left third digit. This could represent a
tiny fracture fragment, age undetermined. No other bony abnormality
identified.
IMPRESSION: Tiny bony density noted the base of the proximal phalanx of the left
third digit. This could represent a tiny fracture fragment, age
undetermined. Exam otherwise unremarkable.

## 2020-11-07 ENCOUNTER — Other Ambulatory Visit: Payer: Self-pay

## 2020-11-08 ENCOUNTER — Encounter: Payer: Self-pay | Admitting: Family Medicine

## 2020-11-08 ENCOUNTER — Ambulatory Visit: Payer: 59 | Admitting: Family Medicine

## 2020-11-08 VITALS — BP 130/90 | HR 91 | Temp 98.3°F | Ht 70.0 in | Wt 214.2 lb

## 2020-11-08 DIAGNOSIS — R7301 Impaired fasting glucose: Secondary | ICD-10-CM

## 2020-11-08 DIAGNOSIS — I10 Essential (primary) hypertension: Secondary | ICD-10-CM

## 2020-11-08 DIAGNOSIS — E785 Hyperlipidemia, unspecified: Secondary | ICD-10-CM | POA: Diagnosis not present

## 2020-11-08 DIAGNOSIS — Z1159 Encounter for screening for other viral diseases: Secondary | ICD-10-CM | POA: Diagnosis not present

## 2020-11-08 LAB — BASIC METABOLIC PANEL
BUN: 9 mg/dL (ref 6–23)
CO2: 24 mEq/L (ref 19–32)
Calcium: 9.7 mg/dL (ref 8.4–10.5)
Chloride: 105 mEq/L (ref 96–112)
Creatinine, Ser: 1.05 mg/dL (ref 0.40–1.50)
GFR: 84.19 mL/min (ref >60.00)
Glucose, Bld: 104 mg/dL — ABNORMAL HIGH (ref 70–99)
Potassium: 4.1 mEq/L (ref 3.5–5.1)
Sodium: 138 mEq/L (ref 135–145)

## 2020-11-08 LAB — CBC
HCT: 40.4 % (ref 39.0–52.0)
Hemoglobin: 13.6 g/dL (ref 13.0–17.0)
MCHC: 33.7 g/dL (ref 30.0–36.0)
MCV: 92.5 fl (ref 78.0–100.0)
Platelets: 263 10*3/uL (ref 150.0–400.0)
RBC: 4.37 Mil/uL (ref 4.22–5.81)
RDW: 13.1 % (ref 11.5–15.5)
WBC: 3.4 10*3/uL — ABNORMAL LOW (ref 4.0–10.5)

## 2020-11-08 LAB — HEMOGLOBIN A1C: Hgb A1c MFr Bld: 6.1 % (ref 4.6–6.5)

## 2020-11-08 LAB — LIPID PANEL
Cholesterol: 197 mg/dL (ref 0–200)
HDL: 39.9 mg/dL (ref >39.00)
LDL Cholesterol: 136 mg/dL — ABNORMAL HIGH (ref 0–99)
NonHDL: 157.44
Total CHOL/HDL Ratio: 5
Triglycerides: 109 mg/dL (ref 0.0–149.0)
VLDL: 21.8 mg/dL (ref 0.0–40.0)

## 2020-11-08 LAB — ALT: ALT: 46 U/L (ref 0–53)

## 2020-11-08 LAB — AST: AST: 29 U/L (ref 0–37)

## 2020-11-08 MED ORDER — LISINOPRIL 10 MG PO TABS: 1.0000 | ORAL_TABLET | Freq: Every day | ORAL | 3 refills | Status: DC

## 2020-11-08 MED ORDER — EZETIMIBE-SIMVASTATIN 10-10 MG PO TABS: 1.0000 | ORAL_TABLET | Freq: Every day | ORAL | 3 refills | Status: DC

## 2020-11-08 NOTE — Patient Instructions (Signed)

## 2020-11-08 NOTE — Progress Notes (Signed)
Chief Complaint  Patient presents with  . Medication Refill    Pt is fasting this morning for lab work    HPI: Micheal Keller is a 50 y.o. male here for HTN, HLD, elevated fasting glucose follow-up. He is fasting for labs.  For HTN pt is taking lisinopril 10mg  daily. For HLD pt is taking vytorin 10-10mg  daily. He does not check BP at home but has machine.  BP Readings from Last 3 Encounters:  11/08/20 130/90  01/06/20 101/73  10/27/19 118/78   Lab Results  Component Value Date   CREATININE 0.95 10/27/2019   BUN 10 10/27/2019   NA 139 10/27/2019   K 3.7 10/27/2019   CL 103 10/27/2019   CO2 27 10/27/2019   Diet: could be better - a lot of salt Exercise: no regular CV exercise  Past Medical History:  Diagnosis Date  . Allergy   . Anxiety   . Hemorrhoids   . Hx of colonic polyps 01/13/2020  . Hyperlipidemia   . Hypertension   . Migraines     Past Surgical History:  Procedure Laterality Date  . COLONOSCOPY    . dental procedures    . HEMORRHOID BANDING      Social History   Socioeconomic History  . Marital status: Married    Spouse name: Not on file  . Number of children: Not on file  . Years of education: Not on file  . Highest education level: Not on file  Occupational History  . Not on file  Tobacco Use  . Smoking status: Former Research scientist (life sciences)  . Smokeless tobacco: Current User    Types: Chew  . Tobacco comment: quit 20 years ago  Vaping Use  . Vaping Use: Never used  Substance and Sexual Activity  . Alcohol use: Yes    Alcohol/week: 0.0 standard drinks    Comment: couple drinks a week   . Drug use: No  . Sexual activity: Not on file  Other Topics Concern  . Not on file  Social History Narrative  . Not on file   Social Determinants of Health   Financial Resource Strain: Not on file  Food Insecurity: Not on file  Transportation Needs: Not on file  Physical Activity: Not on file  Stress: Not on file  Social Connections: Not on file  Intimate  Partner Violence: Not on file    Family History  Problem Relation Age of Onset  . Diabetes Mother   . Hypertension Mother   . Hyperlipidemia Father   . [name] Parkinson White syndrome Brother   . Bone cancer Sister   . Colon cancer Maternal Uncle   . Stomach cancer Maternal Grandmother   . Esophageal cancer Neg Hx   . Rectal cancer Neg Hx      Immunization History  Administered Date(s) Administered  . Influenza Inj Mdck Quad Pf 04/28/2017  . Influenza,inj,Quad PF,6+ Mos 05/12/2014, 04/17/2018  . Influenza-Unspecified 03/21/2016, 05/02/2020  . PFIZER(Purple Top)SARS-COV-2 Vaccination 07/30/2019, 08/20/2019, 06/03/2020    Outpatient Encounter Medications as of 11/08/2020  Medication Sig  . amitriptyline (ELAVIL) 25 MG tablet TAKE 1 TABLET BY MOUTH  DAILY AT BEDTIME  **needs appt before next refill**  . BEPREVE 1.5 % SOLN 1 drop 2 (two) times daily.  . cetirizine (ZYRTEC) 10 MG tablet Take 10 mg by mouth daily.  Marland Kitchen DYMISTA 137-50 MCG/ACT SUSP   . eletriptan (RELPAX) 40 MG tablet TAKE 1 TABLET AS NEEDED FORMIGRAINE MAY REPEAT IN 2   HOURS IF  NECESSARY  . ezetimibe-simvastatin (VYTORIN) 10-10 MG tablet TAKE 1 TABLET BY MOUTH ONCE DAILY  . lisinopril (ZESTRIL) 10 MG tablet TAKE 1 TABLET BY MOUTH  DAILY  . Multiple Vitamin (MULTIVITAMIN ADULT PO) Take by mouth daily.  . sertraline (ZOLOFT) 50 MG tablet Take 1 tablet (50 mg total) by mouth daily.  Marland Kitchen topiramate (TOPAMAX) 50 MG tablet TAKE 1 TABLET BY MOUTH AT  BEDTIME  . [DISCONTINUED] Hyoscyamine Sulfate SL (LEVSIN/SL) 0.125 MG SUBL Place 0.125 mg under the tongue every 8 (eight) hours as needed. (Patient not taking: Reported on 01/06/2020)  . [DISCONTINUED] promethazine (PHENERGAN) 25 MG tablet Take 25 mg by mouth every 6 (six) hours as needed for nausea. (Patient not taking: Reported on 01/06/2020)   No facility-administered encounter medications on file as of 11/08/2020.     ROS: Pertinent positives and negatives noted in HPI. Remainder  of ROS non-contributory  No Known Allergies  BP 130/90 (BP Location: Left Arm, Patient Position: Sitting)   Pulse 91   Temp 98.3 F (36.8 C) (Oral)   Ht 5\' 10"  (1.778 m)   Wt 214 lb 3.2 oz (97.2 kg)   SpO2 97%   BMI 30.73 kg/m    Wt Readings from Last 3 Encounters:  11/08/20 214 lb 3.2 oz (97.2 kg)  01/06/20 214 lb (97.1 kg)  12/23/19 214 lb 12.8 oz (97.4 kg)   Temp Readings from Last 3 Encounters:  11/08/20 98.3 F (36.8 C) (Oral)  01/06/20 (!) 96.8 F (36 C)  10/27/19 (!) 97 F (36.1 C) (Tympanic)   BP Readings from Last 3 Encounters:  11/08/20 130/90  01/06/20 101/73  10/27/19 118/78   Pulse Readings from Last 3 Encounters:  11/08/20 91  01/06/20 (!) 53  10/27/19 69     Physical Exam Constitutional:      General: He is not in acute distress.    Appearance: He is not ill-appearing.  Cardiovascular:     Rate and Rhythm: Normal rate and regular rhythm.     Pulses: Normal pulses.  Pulmonary:     Effort: Pulmonary effort is normal. No respiratory distress.     Breath sounds: Normal breath sounds. No wheezing or rhonchi.  Musculoskeletal:     Right lower leg: No edema.     Left lower leg: No edema.  Neurological:     Mental Status: He is alert.  Psychiatric:        Behavior: Behavior normal.      A/P:  1. Primary hypertension - at goal - Basic metabolic panel - CBC Refill: - lisinopril (ZESTRIL) 10 MG tablet; Take 1 tablet (10 mg total) by mouth daily.  Dispense: 90 tablet; Refill: 3 - pt needs to work on increasing sodium intake and increasing CV exercise  2. Hyperlipidemia, unspecified hyperlipidemia type - Lipid panel - AST - ALT Refill: - ezetimibe-simvastatin (VYTORIN) 10-10 MG tablet; Take 1 tablet by mouth daily.  Dispense: 90 tablet; Refill: 3  3. Elevated fasting glucose - Basic metabolic panel - Hemoglobin A1c  4. Encounter for hepatitis C screening test for low risk patient - Hepatitis C Antibody    This visit occurred  during the SARS-CoV-2 public health emergency.  Safety protocols were in place, including screening questions prior to the visit, additional usage of staff PPE, and extensive cleaning of exam room while observing appropriate contact time as indicated for disinfecting solutions.

## 2020-11-09 LAB — HEPATITIS C ANTIBODY
Hepatitis C Ab: NONREACTIVE
SIGNAL TO CUT-OFF: 0.01 (ref <1.00)

## 2020-11-22 ENCOUNTER — Encounter: Payer: Self-pay | Admitting: Family Medicine

## 2020-11-22 ENCOUNTER — Other Ambulatory Visit: Payer: Self-pay | Admitting: Family Medicine

## 2020-11-22 DIAGNOSIS — E785 Hyperlipidemia, unspecified: Secondary | ICD-10-CM

## 2020-11-22 MED ORDER — EZETIMIBE-SIMVASTATIN 10-20 MG PO TABS: 1.0000 | ORAL_TABLET | Freq: Every day | ORAL | 3 refills | Status: DC

## 2020-11-26 ENCOUNTER — Encounter: Payer: Self-pay | Admitting: Family Medicine

## 2020-11-26 NOTE — Telephone Encounter (Signed)
Please see message and advise.  Thank you. ° °

## 2020-12-12 ENCOUNTER — Other Ambulatory Visit: Payer: Self-pay | Admitting: Family Medicine

## 2020-12-12 DIAGNOSIS — K580 Irritable bowel syndrome with diarrhea: Secondary | ICD-10-CM

## 2021-10-24 ENCOUNTER — Encounter: Payer: Self-pay | Admitting: Internal Medicine

## 2021-10-24 ENCOUNTER — Ambulatory Visit: Payer: 59 | Admitting: Internal Medicine

## 2021-10-24 VITALS — BP 148/108 | HR 65 | Temp 98.5°F | Resp 16 | Ht 70.0 in | Wt 209.0 lb

## 2021-10-24 DIAGNOSIS — Z0001 Encounter for general adult medical examination with abnormal findings: Secondary | ICD-10-CM

## 2021-10-24 DIAGNOSIS — I10 Essential (primary) hypertension: Secondary | ICD-10-CM

## 2021-10-24 DIAGNOSIS — R7303 Prediabetes: Secondary | ICD-10-CM | POA: Insufficient documentation

## 2021-10-24 DIAGNOSIS — Z125 Encounter for screening for malignant neoplasm of prostate: Secondary | ICD-10-CM

## 2021-10-24 DIAGNOSIS — M791 Myalgia, unspecified site: Secondary | ICD-10-CM | POA: Insufficient documentation

## 2021-10-24 DIAGNOSIS — Z23 Encounter for immunization: Secondary | ICD-10-CM

## 2021-10-24 LAB — CBC WITH DIFFERENTIAL/PLATELET
Basophils Absolute: 0 10*3/uL (ref 0.0–0.1)
Basophils Relative: 0.6 % (ref 0.0–3.0)
Eosinophils Absolute: 0.1 10*3/uL (ref 0.0–0.7)
Eosinophils Relative: 2.7 % (ref 0.0–5.0)
HCT: 40.9 % (ref 39.0–52.0)
Hemoglobin: 13.8 g/dL (ref 13.0–17.0)
Lymphocytes Relative: 50.6 % — ABNORMAL HIGH (ref 12.0–46.0)
Lymphs Abs: 2 10*3/uL (ref 0.7–4.0)
MCHC: 33.7 g/dL (ref 30.0–36.0)
MCV: 91.6 fl (ref 78.0–100.0)
Monocytes Absolute: 0.3 10*3/uL (ref 0.1–1.0)
Monocytes Relative: 7 % (ref 3.0–12.0)
Neutro Abs: 1.6 10*3/uL (ref 1.4–7.7)
Neutrophils Relative %: 39.1 % — ABNORMAL LOW (ref 43.0–77.0)
Platelets: 251 10*3/uL (ref 150.0–400.0)
RBC: 4.47 Mil/uL (ref 4.22–5.81)
RDW: 13 % (ref 11.5–15.5)
WBC: 4 10*3/uL (ref 4.0–10.5)

## 2021-10-24 LAB — HEMOGLOBIN A1C: Hgb A1c MFr Bld: 6.3 % (ref 4.6–6.5)

## 2021-10-24 LAB — URINALYSIS, ROUTINE W REFLEX MICROSCOPIC
Bilirubin Urine: NEGATIVE
Hgb urine dipstick: NEGATIVE
Ketones, ur: NEGATIVE
Leukocytes,Ua: NEGATIVE
Nitrite: NEGATIVE
RBC / HPF: NONE SEEN (ref 0–?)
Specific Gravity, Urine: <=1.005 — AB (ref 1.000–1.030)
Total Protein, Urine: NEGATIVE
Urine Glucose: NEGATIVE
Urobilinogen, UA: 0.2 (ref 0.0–1.0)
WBC, UA: NONE SEEN (ref 0–?)
pH: 7 (ref 5.0–8.0)

## 2021-10-24 LAB — BASIC METABOLIC PANEL
BUN: 14 mg/dL (ref 6–23)
CO2: 25 mEq/L (ref 19–32)
Calcium: 9.6 mg/dL (ref 8.4–10.5)
Chloride: 102 mEq/L (ref 96–112)
Creatinine, Ser: 0.98 mg/dL (ref 0.40–1.50)
GFR: 90.84 mL/min (ref >60.00)
Glucose, Bld: 100 mg/dL — ABNORMAL HIGH (ref 70–99)
Potassium: 3.7 mEq/L (ref 3.5–5.1)
Sodium: 138 mEq/L (ref 135–145)

## 2021-10-24 LAB — LIPID PANEL
Cholesterol: 206 mg/dL — ABNORMAL HIGH (ref 0–200)
HDL: 40.7 mg/dL (ref >39.00)
LDL Cholesterol: 147 mg/dL — ABNORMAL HIGH (ref 0–99)
NonHDL: 165.29
Total CHOL/HDL Ratio: 5
Triglycerides: 91 mg/dL (ref 0.0–149.0)
VLDL: 18.2 mg/dL (ref 0.0–40.0)

## 2021-10-24 LAB — C-REACTIVE PROTEIN: CRP: <1 mg/dL (ref 0.5–20.0)

## 2021-10-24 LAB — TSH: TSH: 1.72 u[IU]/mL (ref 0.35–5.50)

## 2021-10-24 LAB — PSA: PSA: 0.65 ng/mL (ref 0.10–4.00)

## 2021-10-24 LAB — CK: Total CK: 154 U/L (ref 7–232)

## 2021-10-24 MED ORDER — INDAPAMIDE 1.25 MG PO TABS: 1.2500 mg | ORAL_TABLET | Freq: Every day | ORAL | 0 refills | Status: DC

## 2021-10-24 MED ORDER — BOOSTRIX 5-2.5-18.5 LF-MCG/0.5 IM SUSP: 0.5000 mL | Freq: Once | INTRAMUSCULAR | 0 refills | Status: AC

## 2021-10-24 MED ORDER — NEBIVOLOL HCL 5 MG PO TABS: 5.0000 mg | ORAL_TABLET | Freq: Every day | ORAL | 0 refills | Status: DC

## 2021-10-24 NOTE — Progress Notes (Signed)
? ?Subjective:  ?Patient ID: Micheal Keller, male    DOB: 28-Mar-1971  Age: 51 y.o. MRN: [id number] ? ?CC: Annual Exam, Hypertension, and Hyperlipidemia ? ?HPI ?[name] presents for a CPX. ? ?He complains of a several month history of myalgias and arthralgias in his upper extremities.  He is taking simvastatin.  He is taking an ACE inhibitor and hydrochlorothiazide for blood pressure control.  He is active and denies chest pain, shortness of breath, diaphoresis, dizziness, lightheadedness, or edema. ? ?History ?[name] has a past medical history of Allergy, Anxiety, Hemorrhoids, colonic polyps (01/13/2020), Hyperlipidemia, Hypertension, and Migraines.  ? ?He has a past surgical history that includes Hemorrhoid banding; dental procedures; and Colonoscopy.  ? ?His family history includes Bone cancer in his sister; Colon cancer in his maternal uncle; Diabetes in his mother; Hyperlipidemia in his father; Hypertension in his mother; Stomach cancer in his maternal grandmother; [name] Parkinson White syndrome in his brother.He reports that he has quit smoking. His smokeless tobacco use includes chew. He reports current alcohol use of about 10.0 standard drinks per week. He reports that he does not use drugs. ? ?Outpatient Medications Prior to Visit  ?Medication Sig Dispense Refill  ? amitriptyline (ELAVIL) 25 MG tablet TAKE 1 TABLET BY MOUTH  DAILY AT BEDTIME 90 tablet 1  ? BEPREVE 1.5 % SOLN 1 drop 2 (two) times daily.    ? cetirizine (ZYRTEC) 10 MG tablet Take 10 mg by mouth daily.    ? DYMISTA 137-50 MCG/ACT SUSP     ? eletriptan (RELPAX) 40 MG tablet TAKE 1 TABLET AS NEEDED FORMIGRAINE MAY REPEAT IN 2   HOURS IF NECESSARY 10 tablet 2  ? Multiple Vitamin (MULTIVITAMIN ADULT PO) Take by mouth daily.    ? sertraline (ZOLOFT) 50 MG tablet Take 1 tablet (50 mg total) by mouth daily. 90 tablet 3  ? topiramate (TOPAMAX) 50 MG tablet TAKE 1 TABLET BY MOUTH AT  BEDTIME 90 tablet 3  ? ezetimibe-simvastatin (VYTORIN) 10-20 MG tablet Take 1  tablet by mouth daily. 90 tablet 3  ? lisinopril (ZESTRIL) 10 MG tablet Take 1 tablet (10 mg total) by mouth daily. 90 tablet 3  ? ?No facility-administered medications prior to visit.  ? ? ?ROS ?Review of Systems  ?Constitutional: Negative.  Negative for diaphoresis and fatigue.  ?HENT: Negative.    ?Eyes: Negative.   ?Respiratory:  Negative for cough, chest tightness, shortness of breath and wheezing.   ?Cardiovascular:  Negative for chest pain, palpitations and leg swelling.  ?Gastrointestinal:  Negative for abdominal pain, constipation, nausea and vomiting.  ?Endocrine: Negative.   ?Genitourinary: Negative.  Negative for difficulty urinating, hematuria, penile swelling, scrotal swelling and testicular pain.  ?Musculoskeletal:  Positive for arthralgias and myalgias. Negative for joint swelling.  ?Skin: Negative.  Negative for color change and pallor.  ?Neurological: Negative.  Negative for dizziness, weakness, light-headedness and headaches.  ?Hematological:  Negative for adenopathy. Does not bruise/bleed easily.  ?Psychiatric/Behavioral: Negative.    ? ?Objective:  ?BP (!) 148/108 (BP Location: Left Arm, Patient Position: Sitting, Cuff Size: Large)   Pulse 65   Temp 98.5 ?F (36.9 ?C) (Oral)   Resp 16   Ht '5\' 10"'$  (1.778 m)   Wt 209 lb (94.8 kg)   SpO2 98%   BMI 29.99 kg/m?  ? ?Physical Exam ?Vitals reviewed.  ?HENT:  ?   Nose: Nose normal.  ?   Mouth/Throat:  ?   Mouth: Mucous membranes are moist.  ?Eyes:  ?  General: No scleral icterus. ?   Conjunctiva/sclera: Conjunctivae normal.  ?Cardiovascular:  ?   Rate and Rhythm: Normal rate and regular rhythm.  ?   Heart sounds: No murmur heard. ?Pulmonary:  ?   Effort: Pulmonary effort is normal.  ?   Breath sounds: No stridor. No wheezing, rhonchi or rales.  ?Abdominal:  ?   General: Abdomen is flat.  ?   Palpations: There is no mass.  ?   Tenderness: There is no abdominal tenderness. There is no guarding or rebound.  ?   Hernia: No hernia is present.   ?Musculoskeletal:     ?   General: No swelling, tenderness or deformity. Normal range of motion.  ?   Cervical back: Neck supple.  ?   Right lower leg: No edema.  ?   Left lower leg: No edema.  ?Lymphadenopathy:  ?   Cervical: No cervical adenopathy.  ?Skin: ?   General: Skin is warm and dry.  ?Neurological:  ?   General: No focal deficit present.  ?   Mental Status: He is alert. Mental status is at baseline.  ?Psychiatric:     ?   Mood and Affect: Mood normal.     ?   Behavior: Behavior normal.  ? ? ?Lab Results  ?Component Value Date  ? WBC 4.0 10/24/2021  ? HGB 13.8 10/24/2021  ? HCT 40.9 10/24/2021  ? PLT 251.0 10/24/2021  ? GLUCOSE 100 (H) 10/24/2021  ? CHOL 206 (H) 10/24/2021  ? TRIG 91.0 10/24/2021  ? HDL 40.70 10/24/2021  ? LDLCALC 147 (H) 10/24/2021  ? ALT 46 11/08/2020  ? AST 29 11/08/2020  ? NA 138 10/24/2021  ? K 3.7 10/24/2021  ? CL 102 10/24/2021  ? CREATININE 0.98 10/24/2021  ? BUN 14 10/24/2021  ? CO2 25 10/24/2021  ? TSH 1.72 10/24/2021  ? PSA 0.65 10/24/2021  ? HGBA1C 6.3 10/24/2021  ?  ? ?Assessment & Plan:  ? ?[name] was seen today for annual exam, hypertension and hyperlipidemia. ? ?Diagnoses and all orders for this visit: ? ?Primary hypertension- His blood pressure is not adequately well controlled.  Will check labs to screen for secondary causes and endorgan damage.  I recommended that he treat this with a beta-blocker and will upgrade to a more potent thiazide diuretic. ?-     Aldosterone + renin activity w/ ratio; Future ?-     CBC with Differential/Platelet; Future ?-     Basic metabolic panel; Future ?-     TSH; Future ?-     Urinalysis, Routine w reflex microscopic; Future ?-     Urinalysis, Routine w reflex microscopic ?-     TSH ?-     Basic metabolic panel ?-     CBC with Differential/Platelet ?-     Aldosterone + renin activity w/ ratio ?-     nebivolol (BYSTOLIC) 5 MG tablet; Take 1 tablet (5 mg total) by mouth daily. ?-     indapamide (LOZOL) 1.25 MG tablet; Take 1 tablet (1.25 mg  total) by mouth daily. ? ?Encounter for general adult medical examination with abnormal findings- Exam completed, labs reviewed, vaccines reviewed and updated, cancer screenings are up-to-date, patient education was given. ?-     PSA; Future ?-     Lipid panel; Future ?-     Lipid panel ?-     PSA ? ?Prediabetes ?-     Hemoglobin A1c; Future ?-     Hemoglobin A1c ? ?Myalgia-  Labs are negative for myopathy or inflammatory arthritis.  We will discontinue the statin. ?-     C-reactive protein; Future ?-     CK; Future ?-     CK ?-     C-reactive protein ? ?Need for prophylactic vaccination with combined diphtheria-tetanus-pertussis (DTP) vaccine ?-     Tdap (BOOSTRIX) 5-2.5-18.5 LF-MCG/0.5 injection; Inject 0.5 mLs into the muscle once for 1 dose. ? ? ?I have discontinued Lavern Molesworth's lisinopril and ezetimibe-simvastatin. I am also having him start on Boostrix, nebivolol, and indapamide. Additionally, I am having him maintain his cetirizine, Dymista, Multiple Vitamin (MULTIVITAMIN ADULT PO), Bepreve, sertraline, topiramate, eletriptan, and amitriptyline. ? ?Meds ordered this encounter  ?Medications  ? Tdap (BOOSTRIX) 5-2.5-18.5 LF-MCG/0.5 injection  ?  Sig: Inject 0.5 mLs into the muscle once for 1 dose.  ?  Dispense:  0.5 mL  ?  Refill:  0  ? nebivolol (BYSTOLIC) 5 MG tablet  ?  Sig: Take 1 tablet (5 mg total) by mouth daily.  ?  Dispense:  90 tablet  ?  Refill:  0  ? indapamide (LOZOL) 1.25 MG tablet  ?  Sig: Take 1 tablet (1.25 mg total) by mouth daily.  ?  Dispense:  90 tablet  ?  Refill:  0  ? ? ? ?Follow-up: Return in about 4 weeks (around 11/21/2021). ? ?Scarlette Calico, MD ?

## 2021-10-24 NOTE — Patient Instructions (Signed)
Hypertension, Adult High blood pressure (hypertension) is when the force of blood pumping through the arteries is too strong. The arteries are the blood vessels that carry blood from the heart throughout the body. Hypertension forces the heart to work harder to pump blood and may cause arteries to become narrow or stiff. Untreated or uncontrolled hypertension can lead to a heart attack, heart failure, a stroke, kidney disease, and other problems. A blood pressure reading consists of a higher number over a lower number. Ideally, your blood pressure should be below 120/80. The first ("top") number is called the systolic pressure. It is a measure of the pressure in your arteries as your heart beats. The second ("bottom") number is called the diastolic pressure. It is a measure of the pressure in your arteries as the heart relaxes. What are the causes? The exact cause of this condition is not known. There are some conditions that result in high blood pressure. What increases the risk? Certain factors may make you more likely to develop high blood pressure. Some of these risk factors are under your control, including: Smoking. Not getting enough exercise or physical activity. Being overweight. Having too much fat, sugar, calories, or salt (sodium) in your diet. Drinking too much alcohol. Other risk factors include: Having a personal history of heart disease, diabetes, high cholesterol, or kidney disease. Stress. Having a family history of high blood pressure and high cholesterol. Having obstructive sleep apnea. Age. The risk increases with age. What are the signs or symptoms? High blood pressure may not cause symptoms. Very high blood pressure (hypertensive crisis) may cause: Headache. Fast or irregular heartbeats (palpitations). Shortness of breath. Nosebleed. Nausea and vomiting. Vision changes. Severe chest pain, dizziness, and seizures. How is this diagnosed? This condition is diagnosed by  measuring your blood pressure while you are seated, with your arm resting on a flat surface, your legs uncrossed, and your feet flat on the floor. The cuff of the blood pressure monitor will be placed directly against the skin of your upper arm at the level of your heart. Blood pressure should be measured at least twice using the same arm. Certain conditions can cause a difference in blood pressure between your right and left arms. If you have a high blood pressure reading during one visit or you have normal blood pressure with other risk factors, you may be asked to: Return on a different day to have your blood pressure checked again. Monitor your blood pressure at home for 1 week or longer. If you are diagnosed with hypertension, you may have other blood or imaging tests to help your health care provider understand your overall risk for other conditions. How is this treated? This condition is treated by making healthy lifestyle changes, such as eating healthy foods, exercising more, and reducing your alcohol intake. You may be referred for counseling on a healthy diet and physical activity. Your health care provider may prescribe medicine if lifestyle changes are not enough to get your blood pressure under control and if: Your systolic blood pressure is above 130. Your diastolic blood pressure is above 80. Your personal target blood pressure may vary depending on your medical conditions, your age, and other factors. Follow these instructions at home: Eating and drinking  Eat a diet that is high in fiber and potassium, and low in sodium, added sugar, and fat. An example of this eating plan is called the DASH diet. DASH stands for Dietary Approaches to Stop Hypertension. To eat this way: Eat   plenty of fresh fruits and vegetables. Try to fill one half of your plate at each meal with fruits and vegetables. Eat whole grains, such as whole-wheat pasta, brown rice, or whole-grain bread. Fill about one  fourth of your plate with whole grains. Eat or drink low-fat dairy products, such as skim milk or low-fat yogurt. Avoid fatty cuts of meat, processed or cured meats, and poultry with skin. Fill about one fourth of your plate with lean proteins, such as fish, chicken without skin, beans, eggs, or tofu. Avoid pre-made and processed foods. These tend to be higher in sodium, added sugar, and fat. Reduce your daily sodium intake. Many people with hypertension should eat less than 1,500 mg of sodium a day. Do not drink alcohol if: Your health care provider tells you not to drink. You are pregnant, may be pregnant, or are planning to become pregnant. If you drink alcohol: Limit how much you have to: 0-1 drink a day for women. 0-2 drinks a day for men. Know how much alcohol is in your drink. In the [place] one drink equals one 12 oz bottle of beer (355 mL), one 5 oz glass of wine (148 mL), or one 1 oz glass of hard liquor (44 mL). Lifestyle  Work with your health care provider to maintain a healthy body weight or to lose weight. Ask what an ideal weight is for you. Get at least 30 minutes of exercise that causes your heart to beat faster (aerobic exercise) most days of the week. Activities may include walking, swimming, or biking. Include exercise to strengthen your muscles (resistance exercise), such as Pilates or lifting weights, as part of your weekly exercise routine. Try to do these types of exercises for 30 minutes at least 3 days a week. Do not use any products that contain nicotine or tobacco. These products include cigarettes, chewing tobacco, and vaping devices, such as e-cigarettes. If you need help quitting, ask your health care provider. Monitor your blood pressure at home as told by your health care provider. Keep all follow-up visits. This is important. Medicines Take over-the-counter and prescription medicines only as told by your health care provider. Follow directions carefully. Blood  pressure medicines must be taken as prescribed. Do not skip doses of blood pressure medicine. Doing this puts you at risk for problems and can make the medicine less effective. Ask your health care provider about side effects or reactions to medicines that you should watch for. Contact a health care provider if you: Think you are having a reaction to a medicine you are taking. Have headaches that keep coming back (recurring). Feel dizzy. Have swelling in your ankles. Have trouble with your vision. Get help right away if you: Develop a severe headache or confusion. Have unusual weakness or numbness. Feel faint. Have severe pain in your chest or abdomen. Vomit repeatedly. Have trouble breathing. These symptoms may be an emergency. Get help right away. Call 911. Do not wait to see if the symptoms will go away. Do not drive yourself to the hospital. Summary Hypertension is when the force of blood pumping through your arteries is too strong. If this condition is not controlled, it may put you at risk for serious complications. Your personal target blood pressure may vary depending on your medical conditions, your age, and other factors. For most people, a normal blood pressure is less than 120/80. Hypertension is treated with lifestyle changes, medicines, or a combination of both. Lifestyle changes include losing weight, eating a healthy,   low-sodium diet, exercising more, and limiting alcohol. This information is not intended to replace advice given to you by your health care provider. Make sure you discuss any questions you have with your health care provider. Document Revised: 04/30/2021 Document Reviewed: 04/30/2021 Elsevier Patient Education  2023 [employer] Inc[employer]  

## 2021-10-25 ENCOUNTER — Encounter: Payer: Self-pay | Admitting: Internal Medicine

## 2021-11-02 LAB — ALDOSTERONE + RENIN ACTIVITY W/ RATIO
ALDO / PRA Ratio: 0.8 Ratio — ABNORMAL LOW (ref 0.9–28.9)
Aldosterone: 4 ng/dL
Renin Activity: 4.87 ng/mL/h (ref 0.25–5.82)

## 2021-12-24 ENCOUNTER — Ambulatory Visit: Payer: 59 | Admitting: Internal Medicine

## 2021-12-24 ENCOUNTER — Encounter: Payer: Self-pay | Admitting: Internal Medicine

## 2021-12-24 VITALS — BP 136/84 | HR 64 | Temp 98.1°F | Resp 16 | Ht 70.0 in | Wt 209.0 lb

## 2021-12-24 DIAGNOSIS — I10 Essential (primary) hypertension: Secondary | ICD-10-CM | POA: Diagnosis not present

## 2021-12-24 DIAGNOSIS — T502X5A Adverse effect of carbonic-anhydrase inhibitors, benzothiadiazides and other diuretics, initial encounter: Secondary | ICD-10-CM

## 2021-12-24 DIAGNOSIS — E876 Hypokalemia: Secondary | ICD-10-CM | POA: Insufficient documentation

## 2021-12-24 LAB — BASIC METABOLIC PANEL
BUN: 17 mg/dL (ref 6–23)
CO2: 28 mEq/L (ref 19–32)
Calcium: 9.7 mg/dL (ref 8.4–10.5)
Chloride: 101 mEq/L (ref 96–112)
Creatinine, Ser: 0.99 mg/dL (ref 0.40–1.50)
GFR: 89.64 mL/min (ref >60.00)
Glucose, Bld: 122 mg/dL — ABNORMAL HIGH (ref 70–99)
Potassium: 3.2 mEq/L — ABNORMAL LOW (ref 3.5–5.1)
Sodium: 137 mEq/L (ref 135–145)

## 2021-12-24 MED ORDER — POTASSIUM CHLORIDE CRYS ER 20 MEQ PO TBCR: 20.0000 meq | EXTENDED_RELEASE_TABLET | Freq: Two times a day (BID) | ORAL | 1 refills | Status: DC

## 2021-12-24 NOTE — Patient Instructions (Signed)
Hypertension, Adult High blood pressure (hypertension) is when the force of blood pumping through the arteries is too strong. The arteries are the blood vessels that carry blood from the heart throughout the body. Hypertension forces the heart to work harder to pump blood and may cause arteries to become narrow or stiff. Untreated or uncontrolled hypertension can lead to a heart attack, heart failure, a stroke, kidney disease, and other problems. A blood pressure reading consists of a higher number over a lower number. Ideally, your blood pressure should be below 120/80. The first ("top") number is called the systolic pressure. It is a measure of the pressure in your arteries as your heart beats. The second ("bottom") number is called the diastolic pressure. It is a measure of the pressure in your arteries as the heart relaxes. What are the causes? The exact cause of this condition is not known. There are some conditions that result in high blood pressure. What increases the risk? Certain factors may make you more likely to develop high blood pressure. Some of these risk factors are under your control, including: Smoking. Not getting enough exercise or physical activity. Being overweight. Having too much fat, sugar, calories, or salt (sodium) in your diet. Drinking too much alcohol. Other risk factors include: Having a personal history of heart disease, diabetes, high cholesterol, or kidney disease. Stress. Having a family history of high blood pressure and high cholesterol. Having obstructive sleep apnea. Age. The risk increases with age. What are the signs or symptoms? High blood pressure may not cause symptoms. Very high blood pressure (hypertensive crisis) may cause: Headache. Fast or irregular heartbeats (palpitations). Shortness of breath. Nosebleed. Nausea and vomiting. Vision changes. Severe chest pain, dizziness, and seizures. How is this diagnosed? This condition is diagnosed by  measuring your blood pressure while you are seated, with your arm resting on a flat surface, your legs uncrossed, and your feet flat on the floor. The cuff of the blood pressure monitor will be placed directly against the skin of your upper arm at the level of your heart. Blood pressure should be measured at least twice using the same arm. Certain conditions can cause a difference in blood pressure between your right and left arms. If you have a high blood pressure reading during one visit or you have normal blood pressure with other risk factors, you may be asked to: Return on a different day to have your blood pressure checked again. Monitor your blood pressure at home for 1 week or longer. If you are diagnosed with hypertension, you may have other blood or imaging tests to help your health care provider understand your overall risk for other conditions. How is this treated? This condition is treated by making healthy lifestyle changes, such as eating healthy foods, exercising more, and reducing your alcohol intake. You may be referred for counseling on a healthy diet and physical activity. Your health care provider may prescribe medicine if lifestyle changes are not enough to get your blood pressure under control and if: Your systolic blood pressure is above 130. Your diastolic blood pressure is above 80. Your personal target blood pressure may vary depending on your medical conditions, your age, and other factors. Follow these instructions at home: Eating and drinking  Eat a diet that is high in fiber and potassium, and low in sodium, added sugar, and fat. An example of this eating plan is called the DASH diet. DASH stands for Dietary Approaches to Stop Hypertension. To eat this way: Eat   plenty of fresh fruits and vegetables. Try to fill one half of your plate at each meal with fruits and vegetables. Eat whole grains, such as whole-wheat pasta, brown rice, or whole-grain bread. Fill about one  fourth of your plate with whole grains. Eat or drink low-fat dairy products, such as skim milk or low-fat yogurt. Avoid fatty cuts of meat, processed or cured meats, and poultry with skin. Fill about one fourth of your plate with lean proteins, such as fish, chicken without skin, beans, eggs, or tofu. Avoid pre-made and processed foods. These tend to be higher in sodium, added sugar, and fat. Reduce your daily sodium intake. Many people with hypertension should eat less than 1,500 mg of sodium a day. Do not drink alcohol if: Your health care provider tells you not to drink. You are pregnant, may be pregnant, or are planning to become pregnant. If you drink alcohol: Limit how much you have to: 0-1 drink a day for women. 0-2 drinks a day for men. Know how much alcohol is in your drink. In the [place] one drink equals one 12 oz bottle of beer (355 mL), one 5 oz glass of wine (148 mL), or one 1 oz glass of hard liquor (44 mL). Lifestyle  Work with your health care provider to maintain a healthy body weight or to lose weight. Ask what an ideal weight is for you. Get at least 30 minutes of exercise that causes your heart to beat faster (aerobic exercise) most days of the week. Activities may include walking, swimming, or biking. Include exercise to strengthen your muscles (resistance exercise), such as Pilates or lifting weights, as part of your weekly exercise routine. Try to do these types of exercises for 30 minutes at least 3 days a week. Do not use any products that contain nicotine or tobacco. These products include cigarettes, chewing tobacco, and vaping devices, such as e-cigarettes. If you need help quitting, ask your health care provider. Monitor your blood pressure at home as told by your health care provider. Keep all follow-up visits. This is important. Medicines Take over-the-counter and prescription medicines only as told by your health care provider. Follow directions carefully. Blood  pressure medicines must be taken as prescribed. Do not skip doses of blood pressure medicine. Doing this puts you at risk for problems and can make the medicine less effective. Ask your health care provider about side effects or reactions to medicines that you should watch for. Contact a health care provider if you: Think you are having a reaction to a medicine you are taking. Have headaches that keep coming back (recurring). Feel dizzy. Have swelling in your ankles. Have trouble with your vision. Get help right away if you: Develop a severe headache or confusion. Have unusual weakness or numbness. Feel faint. Have severe pain in your chest or abdomen. Vomit repeatedly. Have trouble breathing. These symptoms may be an emergency. Get help right away. Call 911. Do not wait to see if the symptoms will go away. Do not drive yourself to the hospital. Summary Hypertension is when the force of blood pumping through your arteries is too strong. If this condition is not controlled, it may put you at risk for serious complications. Your personal target blood pressure may vary depending on your medical conditions, your age, and other factors. For most people, a normal blood pressure is less than 120/80. Hypertension is treated with lifestyle changes, medicines, or a combination of both. Lifestyle changes include losing weight, eating a healthy,   low-sodium diet, exercising more, and limiting alcohol. This information is not intended to replace advice given to you by your health care provider. Make sure you discuss any questions you have with your health care provider. Document Revised: 04/30/2021 Document Reviewed: 04/30/2021 Elsevier Patient Education  2023 [employer] Inc[employer]  

## 2021-12-24 NOTE — Progress Notes (Signed)
Subjective:  Patient ID: Micheal Keller, male    DOB: [birth year]  Age: 51 y.o. MRN: [id number]  CC: Hypertension   HPI Micheal Keller presents for f/up -  He is active and denies chest pain, shortness of breath, dizziness, lightheadedness, edema, or fatigue.  Outpatient Medications Prior to Visit  Medication Sig Dispense Refill   amitriptyline (ELAVIL) 25 MG tablet TAKE 1 TABLET BY MOUTH  DAILY AT BEDTIME 90 tablet 1   BEPREVE 1.5 % SOLN 1 drop 2 (two) times daily.     cetirizine (ZYRTEC) 10 MG tablet Take 10 mg by mouth daily.     DYMISTA 137-50 MCG/ACT SUSP      eletriptan (RELPAX) 40 MG tablet TAKE 1 TABLET AS NEEDED FORMIGRAINE MAY REPEAT IN 2   HOURS IF NECESSARY 10 tablet 2   indapamide (LOZOL) 1.25 MG tablet Take 1 tablet (1.25 mg total) by mouth daily. 90 tablet 0   Multiple Vitamin (MULTIVITAMIN ADULT PO) Take by mouth daily.     nebivolol (BYSTOLIC) 5 MG tablet Take 1 tablet (5 mg total) by mouth daily. 90 tablet 0   sertraline (ZOLOFT) 50 MG tablet Take 1 tablet (50 mg total) by mouth daily. 90 tablet 3   topiramate (TOPAMAX) 50 MG tablet TAKE 1 TABLET BY MOUTH AT  BEDTIME 90 tablet 3   No facility-administered medications prior to visit.    ROS Review of Systems  Constitutional: Negative.  Negative for diaphoresis and fatigue.  HENT: Negative.    Eyes: Negative.   Respiratory:  Negative for cough, chest tightness, shortness of breath and wheezing.   Cardiovascular:  Negative for chest pain, palpitations and leg swelling.  Gastrointestinal:  Negative for abdominal pain, constipation, diarrhea, nausea and vomiting.  Endocrine: Negative.   Genitourinary: Negative.  Negative for difficulty urinating.  Musculoskeletal: Negative.   Skin: Negative.   Neurological:  Negative for dizziness, weakness and headaches.  Hematological:  Negative for adenopathy. Does not bruise/bleed easily.  Psychiatric/Behavioral: Negative.      Objective:  BP 136/84 (BP Location: Right Arm, Patient  Position: Sitting, Cuff Size: Large)   Pulse 64   Temp 98.1 F (36.7 C) (Oral)   Resp 16   Ht 5\' 10"  (1.778 m)   Wt 209 lb (94.8 kg)   SpO2 97%   BMI 29.99 kg/m   BP Readings from Last 3 Encounters:  12/24/21 136/84  10/24/21 (!) 148/108  11/08/20 130/90    Wt Readings from Last 3 Encounters:  12/24/21 209 lb (94.8 kg)  10/24/21 209 lb (94.8 kg)  11/08/20 214 lb 3.2 oz (97.2 kg)    Physical Exam Vitals reviewed.  HENT:     Nose: Nose normal.     Mouth/Throat:     Mouth: Mucous membranes are moist.  Eyes:     General: No scleral icterus.    Conjunctiva/sclera: Conjunctivae normal.  Cardiovascular:     Rate and Rhythm: Normal rate and regular rhythm.     Heart sounds: No murmur heard. Pulmonary:     Effort: Pulmonary effort is normal.     Breath sounds: No stridor. No wheezing, rhonchi or rales.  Abdominal:     General: Abdomen is flat.     Palpations: There is no mass.     Tenderness: There is no abdominal tenderness. There is no guarding.     Hernia: No hernia is present.  Musculoskeletal:        General: Normal range of motion.     Cervical back:  Neck supple.     Right lower leg: No edema.     Left lower leg: No edema.  Lymphadenopathy:     Cervical: No cervical adenopathy.  Skin:    General: Skin is warm and dry.  Neurological:     General: No focal deficit present.     Mental Status: He is alert.  Psychiatric:        Mood and Affect: Mood normal.        Behavior: Behavior normal.     Lab Results  Component Value Date   WBC 4.0 10/24/2021   HGB 13.8 10/24/2021   HCT 40.9 10/24/2021   PLT 251.0 10/24/2021   GLUCOSE 122 (H) 12/24/2021   CHOL 206 (H) 10/24/2021   TRIG 91.0 10/24/2021   HDL 40.70 10/24/2021   LDLCALC 147 (H) 10/24/2021   ALT 46 11/08/2020   AST 29 11/08/2020   NA 137 12/24/2021   K 3.2 (L) 12/24/2021   CL 101 12/24/2021   CREATININE 0.99 12/24/2021   BUN 17 12/24/2021   CO2 28 12/24/2021   TSH 1.72 10/24/2021   PSA 0.65  10/24/2021   HGBA1C 6.3 10/24/2021    MR FINGERS LEFT W/O CM  Result Date: 08/03/2018 CLINICAL DATA:  Left long finger pain since the patient suffered an injury attempting to catch a football 2-1/2 months ago. Question volar plate injury at the PIP joint of the left long finger. Initial encounter. EXAM: MRI OF THE LEFT FINGERS WITHOUT CONTRAST TECHNIQUE: Multiplanar, multisequence MR imaging of the left fingers was performed. No intravenous contrast was administered. COMPARISON:  None. FINDINGS: Bones/Joint/Cartilage There is marrow edema in the neck of the proximal phalanx of the long finger consistent with contusion. No fracture is identified. Ligaments The radial collateral ligament is torn from its attachment to the proximal phalanx of the long finger. The accessory collateral ligament on the radial side is also torn from the proximal phalanx of the long finger. The volar plate remains attached to the base of the middle phalanx of the long finger but the attachment is attenuated consistent with partial tear. The volar plate appears irregular and edematous. No pulley injury is identified. Muscles and Tendons Intact. Soft tissues Edema about the PIP joint of the long fingers worse on the radial side. IMPRESSION: Tears of both the radial collateral ligament and accessory radial collateral ligament from the proximal phalanx of the long finger. Partial tear of the volar plate from the base of the middle phalanx of the long finger. The volar plate is attenuated and edematous consistent with contusion. Edema in the distal aspect of the proximal phalanx of the long finger consistent with contusion. Electronically Signed   By: Drusilla Kanner M.D.   On: 08/03/2018 10:01    Assessment & Plan:   Micheal Keller was seen today for hypertension.  Diagnoses and all orders for this visit:  Primary hypertension- His blood pressure is adequately well controlled.  I will treat the hypokalemia. -     Basic metabolic panel;  Future -     Basic metabolic panel -     potassium chloride SA (KLOR-CON M) 20 MEQ tablet; Take 1 tablet (20 mEq total) by mouth 2 (two) times daily.  Diuretic-induced hypokalemia -     potassium chloride SA (KLOR-CON M) 20 MEQ tablet; Take 1 tablet (20 mEq total) by mouth 2 (two) times daily.   I am having Jediah Kinnison start on potassium chloride SA. I am also having him maintain his cetirizine,  Dymista, Multiple Vitamin (MULTIVITAMIN ADULT PO), Bepreve, sertraline, topiramate, eletriptan, amitriptyline, nebivolol, and indapamide.  Meds ordered this encounter  Medications   potassium chloride SA (KLOR-CON M) 20 MEQ tablet    Sig: Take 1 tablet (20 mEq total) by mouth 2 (two) times daily.    Dispense:  180 tablet    Refill:  1     Follow-up: Return in about 6 months (around 06/25/2022).  Sanda Linger, MD

## 2022-01-21 ENCOUNTER — Other Ambulatory Visit: Payer: Self-pay | Admitting: Internal Medicine

## 2022-01-21 DIAGNOSIS — I10 Essential (primary) hypertension: Secondary | ICD-10-CM

## 2022-01-21 MED ORDER — INDAPAMIDE 1.25 MG PO TABS: 1.2500 mg | ORAL_TABLET | Freq: Every day | ORAL | 0 refills | Status: DC

## 2022-01-21 MED ORDER — NEBIVOLOL HCL 5 MG PO TABS: 5.0000 mg | ORAL_TABLET | Freq: Every day | ORAL | 0 refills | Status: DC

## 2022-05-22 ENCOUNTER — Other Ambulatory Visit: Payer: Self-pay | Admitting: Internal Medicine

## 2022-05-22 DIAGNOSIS — I10 Essential (primary) hypertension: Secondary | ICD-10-CM

## 2022-05-23 ENCOUNTER — Other Ambulatory Visit (HOSPITAL_COMMUNITY): Payer: Self-pay

## 2022-05-23 ENCOUNTER — Encounter: Payer: Self-pay | Admitting: Internal Medicine

## 2022-05-23 DIAGNOSIS — I10 Essential (primary) hypertension: Secondary | ICD-10-CM

## 2022-05-23 MED ORDER — INDAPAMIDE 1.25 MG PO TABS
1.2500 mg | ORAL_TABLET | Freq: Every day | ORAL | 0 refills | Status: DC
  Filled 2022-05-23: qty 90, 90d supply, fill #0

## 2022-05-23 MED ORDER — NEBIVOLOL HCL 5 MG PO TABS: 5.0000 mg | ORAL_TABLET | Freq: Every day | ORAL | 0 refills | Status: DC

## 2022-05-23 MED ORDER — NEBIVOLOL HCL 5 MG PO TABS
5.0000 mg | ORAL_TABLET | Freq: Every day | ORAL | 0 refills | Status: DC
  Filled 2022-05-23: qty 90, 90d supply, fill #0

## 2022-05-23 MED ORDER — INDAPAMIDE 1.25 MG PO TABS: 1.2500 mg | ORAL_TABLET | Freq: Every day | ORAL | 0 refills | Status: DC

## 2022-07-16 ENCOUNTER — Ambulatory Visit (INDEPENDENT_AMBULATORY_CARE_PROVIDER_SITE_OTHER): Payer: 59 | Admitting: Internal Medicine

## 2022-07-16 ENCOUNTER — Encounter: Payer: Self-pay | Admitting: Internal Medicine

## 2022-07-16 VITALS — BP 138/88 | HR 54 | Temp 98.4°F | Resp 16 | Ht 70.0 in | Wt 216.0 lb

## 2022-07-16 DIAGNOSIS — E876 Hypokalemia: Secondary | ICD-10-CM | POA: Diagnosis not present

## 2022-07-16 DIAGNOSIS — R7303 Prediabetes: Secondary | ICD-10-CM | POA: Diagnosis not present

## 2022-07-16 DIAGNOSIS — Z23 Encounter for immunization: Secondary | ICD-10-CM | POA: Diagnosis not present

## 2022-07-16 DIAGNOSIS — I10 Essential (primary) hypertension: Secondary | ICD-10-CM

## 2022-07-16 DIAGNOSIS — R001 Bradycardia, unspecified: Secondary | ICD-10-CM | POA: Diagnosis not present

## 2022-07-16 DIAGNOSIS — T502X5A Adverse effect of carbonic-anhydrase inhibitors, benzothiadiazides and other diuretics, initial encounter: Secondary | ICD-10-CM | POA: Diagnosis not present

## 2022-07-16 DIAGNOSIS — E119 Type 2 diabetes mellitus without complications: Secondary | ICD-10-CM

## 2022-07-16 DIAGNOSIS — E785 Hyperlipidemia, unspecified: Secondary | ICD-10-CM

## 2022-07-16 LAB — CBC WITH DIFFERENTIAL/PLATELET
Basophils Absolute: 0 10*3/uL (ref 0.0–0.1)
Basophils Relative: 0.8 % (ref 0.0–3.0)
Eosinophils Absolute: 0.1 10*3/uL (ref 0.0–0.7)
Eosinophils Relative: 3.4 % (ref 0.0–5.0)
HCT: 41.1 % (ref 39.0–52.0)
Hemoglobin: 14 g/dL (ref 13.0–17.0)
Lymphocytes Relative: 53.2 % — ABNORMAL HIGH (ref 12.0–46.0)
Lymphs Abs: 2.3 10*3/uL (ref 0.7–4.0)
MCHC: 34.1 g/dL (ref 30.0–36.0)
MCV: 91.3 fl (ref 78.0–100.0)
Monocytes Absolute: 0.4 10*3/uL (ref 0.1–1.0)
Monocytes Relative: 8.3 % (ref 3.0–12.0)
Neutro Abs: 1.5 10*3/uL (ref 1.4–7.7)
Neutrophils Relative %: 34.3 % — ABNORMAL LOW (ref 43.0–77.0)
Platelets: 275 10*3/uL (ref 150.0–400.0)
RBC: 4.5 Mil/uL (ref 4.22–5.81)
RDW: 13.1 % (ref 11.5–15.5)
WBC: 4.4 10*3/uL (ref 4.0–10.5)

## 2022-07-16 LAB — BASIC METABOLIC PANEL
BUN: 15 mg/dL (ref 6–23)
CO2: 28 mEq/L (ref 19–32)
Calcium: 9.3 mg/dL (ref 8.4–10.5)
Chloride: 101 mEq/L (ref 96–112)
Creatinine, Ser: 1.11 mg/dL (ref 0.40–1.50)
GFR: 77.83 mL/min (ref >60.00)
Glucose, Bld: 95 mg/dL (ref 70–99)
Potassium: 3.9 mEq/L (ref 3.5–5.1)
Sodium: 138 mEq/L (ref 135–145)

## 2022-07-16 LAB — MAGNESIUM: Magnesium: 2.2 mg/dL (ref 1.5–2.5)

## 2022-07-16 LAB — HEPATIC FUNCTION PANEL
ALT: 31 U/L (ref 0–53)
AST: 24 U/L (ref 0–37)
Albumin: 4.6 g/dL (ref 3.5–5.2)
Alkaline Phosphatase: 57 U/L (ref 39–117)
Bilirubin, Direct: 0.1 mg/dL (ref 0.0–0.3)
Total Bilirubin: 0.7 mg/dL (ref 0.2–1.2)
Total Protein: 7.5 g/dL (ref 6.0–8.3)

## 2022-07-16 LAB — TSH: TSH: 1.59 u[IU]/mL (ref 0.35–5.50)

## 2022-07-16 LAB — HEMOGLOBIN A1C: Hgb A1c MFr Bld: 6.7 % — ABNORMAL HIGH (ref 4.6–6.5)

## 2022-07-16 NOTE — Patient Instructions (Signed)
Bradycardia, Adult Bradycardia is a slower-than-normal heartbeat. A normal resting heart rate for an adult ranges from 60 to 100 beats per minute. With bradycardia, the resting heart rate is less than 60 beats per minute. Bradycardia can prevent enough oxygen from reaching certain areas of your body when you are active. It can be serious if it keeps enough oxygen from reaching your brain and other parts of your body. Bradycardia is not a problem for everyone. For some healthy adults, a slow resting heart rate is normal. What are the causes? This condition may be caused by: A problem with the heart, including: A problem with the heart's electrical system, such as a heart block. With a heart block, electrical signals between the chambers of the heart are partially or completely blocked, so they are not able to work as they should. A problem with the heart's natural pacemaker (sinus node). Heart disease. A heart attack. Heart damage. Lyme disease. A heart infection. A heart condition that is present at birth (congenital heart defect). Certain medicines that treat heart conditions. Certain conditions, such as hypothyroidism and obstructive sleep apnea. Problems with the balance of chemicals and other substances, like potassium, in the blood. Trauma. Radiation therapy. What increases the risk? You are more likely to develop this condition if you: Are age 65 or older. Have high blood pressure (hypertension), high cholesterol (hyperlipidemia), or diabetes. Drink heavily, use tobacco or nicotine products, or use drugs. What are the signs or symptoms? Symptoms of this condition include: Light-headedness. Feeling faint or fainting. Fatigue and weakness. Trouble with activity or exercise. Shortness of breath. Chest pain (angina). Drowsiness. Confusion. Dizziness. How is this diagnosed? This condition may be diagnosed based on: Your symptoms. Your medical history. A physical exam. During  the exam, your health care provider will listen to your heartbeat and check your pulse. To confirm the diagnosis, your health care provider may order tests, such as: Blood tests. An electrocardiogram (ECG). This test records the heart's electrical activity. The test can show how fast your heart is beating and whether the heartbeat is steady. A test in which you wear a portable device (event recorder or Holter monitor) to record your heart's electrical activity while you go about your day. An exercise test. How is this treated? Treatment for this condition depends on the cause of the condition and how severe your symptoms are. Treatment may involve: Treatment of the underlying condition. Changing your medicines or how much medicine you take. Having a small, battery-operated device called a pacemaker implanted under the skin. When bradycardia occurs, this device can be used to increase your heart rate and help your heart beat in a regular rhythm. Follow these instructions at home: Lifestyle Manage any health conditions that contribute to bradycardia as told by your health care provider. Follow a heart-healthy diet. A nutrition specialist (dietitian) can help educate you about healthy food options and changes. Follow an exercise program that is approved by your health care provider. Maintain a healthy weight. Try to reduce or manage your stress, such as with yoga or meditation. If you need help reducing stress, ask your health care provider. Do not use any products that contain nicotine or tobacco. These products include cigarettes, chewing tobacco, and vaping devices, such as e-cigarettes. If you need help quitting, ask your health care provider. Do not use illegal drugs. Alcohol use If you drink alcohol: Limit how much you have to: 0-1 drink a day for women who are not pregnant. 0-2 drinks a day   for men. Know how much alcohol is in a drink. In the [place] one drink equals one 12 oz bottle of  beer (355 mL), one 5 oz glass of wine (148 mL), or one 1 oz glass of hard liquor (44 mL). General instructions Take over-the-counter and prescription medicines only as told by your health care provider. Keep all follow-up visits. This is important. How is this prevented? In some cases, bradycardia may be prevented by: Treating underlying medical problems. Stopping behaviors or medicines that can trigger the condition. Contact a health care provider if: You feel light-headed or dizzy. You almost faint. You feel weak or are easily fatigued during physical activity. You experience confusion or have memory problems. Get help right away if: You faint. You have chest pains or an irregular heartbeat (palpitations). You have trouble breathing. These symptoms may represent a serious problem that is an emergency. Do not wait to see if the symptoms will go away. Get medical help right away. Call your local emergency services (911 in the [place] Do not drive yourself to the hospital. Summary Bradycardia is a slower-than-normal heartbeat. With bradycardia, the resting heart rate is less than 60 beats per minute. Treatment for this condition depends on the cause. Manage any health conditions that contribute to bradycardia as told by your health care provider. Do not use any products that contain nicotine or tobacco. These products include cigarettes, chewing tobacco, and vaping devices, such as e-cigarettes. Keep all follow-up visits. This is important. This information is not intended to replace advice given to you by your health care provider. Make sure you discuss any questions you have with your health care provider. Document Revised: 10/14/2020 Document Reviewed: 10/14/2020 Elsevier Patient Education  2023 [employer]  

## 2022-07-16 NOTE — Progress Notes (Addendum)
Subjective:  Patient ID: Lio Wehrly, male    DOB: 10/14/1970  Age: 52 y.o. MRN: [id number]  CC: Hypertension   HPI Rykker Coviello presents for f/up ----  He exercises for about 30 minutes a day on a treadmill.  His endurance is good.  He denies chest pain, shortness of breath, near syncope, dizziness, lightheadedness, or edema.  Outpatient Medications Prior to Visit  Medication Sig Dispense Refill   amitriptyline (ELAVIL) 25 MG tablet TAKE 1 TABLET BY MOUTH  DAILY AT BEDTIME 90 tablet 1   BEPREVE 1.5 % SOLN 1 drop 2 (two) times daily.     cetirizine (ZYRTEC) 10 MG tablet Take 10 mg by mouth daily.     DYMISTA 137-50 MCG/ACT SUSP      eletriptan (RELPAX) 40 MG tablet TAKE 1 TABLET AS NEEDED FORMIGRAINE MAY REPEAT IN 2   HOURS IF NECESSARY 10 tablet 2   indapamide (LOZOL) 1.25 MG tablet Take 1 tablet (1.25 mg total) by mouth daily. 90 tablet 0   Multiple Vitamin (MULTIVITAMIN ADULT PO) Take by mouth daily.     potassium chloride SA (KLOR-CON M) 20 MEQ tablet Take 1 tablet (20 mEq total) by mouth 2 (two) times daily. 180 tablet 1   sertraline (ZOLOFT) 50 MG tablet Take 1 tablet (50 mg total) by mouth daily. 90 tablet 3   topiramate (TOPAMAX) 50 MG tablet TAKE 1 TABLET BY MOUTH AT  BEDTIME 90 tablet 3   nebivolol (BYSTOLIC) 5 MG tablet Take 1 tablet (5 mg total) by mouth daily. 90 tablet 0   No facility-administered medications prior to visit.    ROS Review of Systems  Constitutional: Negative.  Negative for chills, diaphoresis, fatigue and fever.  HENT: Negative.    Eyes: Negative.   Respiratory:  Negative for chest tightness, shortness of breath and wheezing.   Cardiovascular:  Negative for chest pain, palpitations and leg swelling.  Gastrointestinal:  Negative for abdominal pain, diarrhea and nausea.  Endocrine: Negative.   Genitourinary:  Negative for difficulty urinating.  Musculoskeletal: Negative.  Negative for arthralgias and myalgias.  Skin: Negative.   Neurological:  Negative  for dizziness, syncope, weakness and light-headedness.  Hematological:  Negative for adenopathy. Does not bruise/bleed easily.  Psychiatric/Behavioral: Negative.      Objective:  BP 138/88 (BP Location: Left Arm, Patient Position: Sitting, Cuff Size: Large)   Pulse (!) 54   Temp 98.4 F (36.9 C) (Oral)   Resp 16   Ht '5\' 10"'$  (1.778 m)   Wt 216 lb (98 kg)   SpO2 94%   BMI 30.99 kg/m   BP Readings from Last 3 Encounters:  07/16/22 138/88  12/24/21 136/84  10/24/21 (!) 148/108    Wt Readings from Last 3 Encounters:  07/16/22 216 lb (98 kg)  12/24/21 209 lb (94.8 kg)  10/24/21 209 lb (94.8 kg)    Physical Exam Vitals reviewed.  HENT:     Nose: Nose normal.     Mouth/Throat:     Mouth: Mucous membranes are moist.  Eyes:     General: No scleral icterus.    Conjunctiva/sclera: Conjunctivae normal.  Cardiovascular:     Rate and Rhythm: Bradycardia present.     Heart sounds: Normal heart sounds, S1 normal and S2 normal. No murmur heard.    Comments: EKG- SB, 51 bpm Mild LVH ?LAE No Q waves Pulmonary:     Effort: Pulmonary effort is normal.     Breath sounds: No stridor. No wheezing, rhonchi or rales.  Abdominal:     General: Abdomen is flat.     Palpations: There is no mass.     Tenderness: There is no abdominal tenderness. There is no guarding.     Hernia: No hernia is present.  Musculoskeletal:        General: Normal range of motion.     Cervical back: Neck supple.     Right lower leg: No edema.     Left lower leg: No edema.  Lymphadenopathy:     Cervical: No cervical adenopathy.  Skin:    General: Skin is warm and dry.  Neurological:     General: No focal deficit present.     Mental Status: He is alert.  Psychiatric:        Mood and Affect: Mood normal.        Behavior: Behavior normal.     Lab Results  Component Value Date   WBC 4.4 07/16/2022   HGB 14.0 07/16/2022   HCT 41.1 07/16/2022   PLT 275.0 07/16/2022   GLUCOSE 95 07/16/2022   CHOL 206  (H) 10/24/2021   TRIG 91.0 10/24/2021   HDL 40.70 10/24/2021   LDLCALC 147 (H) 10/24/2021   ALT 31 07/16/2022   AST 24 07/16/2022   NA 138 07/16/2022   K 3.9 07/16/2022   CL 101 07/16/2022   CREATININE 1.11 07/16/2022   BUN 15 07/16/2022   CO2 28 07/16/2022   TSH 1.59 07/16/2022   PSA 0.65 10/24/2021   HGBA1C 6.7 (H) 07/16/2022    MR FINGERS LEFT W/O CM  Result Date: 08/03/2018 CLINICAL DATA:  Left long finger pain since the patient suffered an injury attempting to catch a football 2-1/2 months ago. Question volar plate injury at the PIP joint of the left long finger. Initial encounter. EXAM: MRI OF THE LEFT FINGERS WITHOUT CONTRAST TECHNIQUE: Multiplanar, multisequence MR imaging of the left fingers was performed. No intravenous contrast was administered. COMPARISON:  None. FINDINGS: Bones/Joint/Cartilage There is marrow edema in the neck of the proximal phalanx of the long finger consistent with contusion. No fracture is identified. Ligaments The radial collateral ligament is torn from its attachment to the proximal phalanx of the long finger. The accessory collateral ligament on the radial side is also torn from the proximal phalanx of the long finger. The volar plate remains attached to the base of the middle phalanx of the long finger but the attachment is attenuated consistent with partial tear. The volar plate appears irregular and edematous. No pulley injury is identified. Muscles and Tendons Intact. Soft tissues Edema about the PIP joint of the long fingers worse on the radial side. IMPRESSION: Tears of both the radial collateral ligament and accessory radial collateral ligament from the proximal phalanx of the long finger. Partial tear of the volar plate from the base of the middle phalanx of the long finger. The volar plate is attenuated and edematous consistent with contusion. Edema in the distal aspect of the proximal phalanx of the long finger consistent with contusion.  Electronically Signed   By: Inge Rise M.D.   On: 08/03/2018 10:01    Assessment & Plan:   Ivie was seen today for hypertension.  Diagnoses and all orders for this visit:  Prediabetes- His A1c is up to 6.7%. -     Basic metabolic panel; Future -     Hemoglobin A1c; Future -     Hemoglobin A1c -     Basic metabolic panel  Primary hypertension- His blood pressure  is well-controlled.  He is bradycardic so we will discontinue the beta-blocker. -     CBC with Differential/Platelet; Future -     Hepatic function panel; Future -     Magnesium; Future -     TSH; Future -     EKG 12-Lead -     TSH -     Magnesium -     Hepatic function panel -     CBC with Differential/Platelet  Diuretic-induced hypokalemia -     Basic metabolic panel; Future -     Magnesium; Future -     Magnesium -     Basic metabolic panel  Bradycardia- He wants to see a cardiologist. -     TSH; Future -     Ambulatory referral to Cardiology -     TSH  Type 2 diabetes mellitus without complication, without long-term current use of insulin (HCC)  Hyperlipidemia LDL goal <130- I recommend that he take a statin for cardiovascular risk reduction. -     Pitavastatin Calcium 2 MG TABS; Take 1 tablet (2 mg total) by mouth daily.  Other orders -     Tdap vaccine greater than or equal to 7yo IM   I have discontinued Tyrell Henault's nebivolol. I am also having him start on Pitavastatin Calcium. Additionally, I am having him maintain his cetirizine, Dymista, Multiple Vitamin (MULTIVITAMIN ADULT PO), Bepreve, sertraline, topiramate, eletriptan, amitriptyline, potassium chloride SA, and indapamide.  Meds ordered this encounter  Medications   Pitavastatin Calcium 2 MG TABS    Sig: Take 1 tablet (2 mg total) by mouth daily.    Dispense:  90 tablet    Refill:  1     Follow-up: No follow-ups on file.  Scarlette Calico, MD

## 2022-07-17 ENCOUNTER — Encounter: Payer: Self-pay | Admitting: Internal Medicine

## 2022-07-17 MED ORDER — PITAVASTATIN CALCIUM 2 MG PO TABS: 2.0000 mg | ORAL_TABLET | Freq: Every day | ORAL | 1 refills | Status: DC

## 2022-07-25 ENCOUNTER — Ambulatory Visit: Payer: 59 | Attending: Cardiovascular Disease | Admitting: Cardiovascular Disease

## 2022-07-25 ENCOUNTER — Encounter: Payer: Self-pay | Admitting: Cardiovascular Disease

## 2022-07-25 VITALS — BP 132/80 | HR 56 | Ht 71.0 in | Wt 215.6 lb

## 2022-07-25 DIAGNOSIS — R001 Bradycardia, unspecified: Secondary | ICD-10-CM

## 2022-07-25 NOTE — Patient Instructions (Signed)
Medication Instructions:  Your physician recommends that you continue on your current medications as directed. Please refer to the Current Medication list given to you today.  *If you need a refill on your cardiac medications before your next appointment, please call your pharmacy*   Lab Work: NONE If you have labs (blood work) drawn today and your tests are completely normal, you will receive your results only by: [place] (if you have MyChart) OR A paper copy in the mail If you have any lab test that is abnormal or we need to change your treatment, we will call you to review the results.   Testing/Procedures: NONE   Follow-Up: At [facility], you and your health needs are our priority.  As part of our continuing mission to provide you with exceptional heart care, we have created designated Provider Care Teams.  These Care Teams include your primary Cardiologist (physician) and Advanced Practice Providers (APPs -  Physician Assistants and Nurse Practitioners) who all work together to provide you with the care you need, when you need it.  We recommend signing up for the patient portal called "MyChart".  Sign up information is provided on this After Visit Summary.  MyChart is used to connect with patients for Virtual Visits (Telemedicine).  Patients are able to view lab/test results, encounter notes, upcoming appointments, etc.  Non-urgent messages can be sent to your provider as well.   To learn more about what you can do with MyChart, go to [email or web address].    Your next appointment:   As needed  Provider:   Mertie Moores, MD

## 2022-07-25 NOTE — Progress Notes (Signed)
Cardiology Office Note:    Date:  07/25/2022   ID:  Micheal Keller, DOB Mar 25, 1971, MRN [id number]  PCP:  Janith Lima, MD   [place] Prov[facility] Cardiologist:   Danyael Alipio   Referring MD: Janith Lima, MD   Chief Complaint  Patient presents with   Hypertension     History of Present Illness:    Micheal Keller is a 52 y.o. male with a hx of htn Was recently found to have a slow HR   No episodes of syncope.    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   Past Medical History:  Diagnosis Date   Allergy    Anxiety    Hemorrhoids    Hx of colonic polyps 01/13/2020   Hyperlipidemia    Hypertension    Migraines     Past Surgical History:  Procedure Laterality Date   COLONOSCOPY     dental procedures     HEMORRHOID BANDING      Current Medications: Current Meds  Medication Sig   amitriptyline (ELAVIL) 25 MG tablet TAKE 1 TABLET BY MOUTH  DAILY AT BEDTIME   BEPREVE 1.5 % SOLN 1 drop 2 (two) times daily.   cetirizine (ZYRTEC) 10 MG tablet Take 10 mg by mouth daily.   DYMISTA 137-50 MCG/ACT SUSP    eletriptan (RELPAX) 40 MG tablet TAKE 1 TABLET AS NEEDED FORMIGRAINE MAY REPEAT IN 2   HOURS IF NECESSARY   indapamide (LOZOL) 1.25 MG tablet Take 1 tablet (1.25 mg total) by mouth daily.   Multiple Vitamin (MULTIVITAMIN ADULT PO) Take by mouth daily.   Pitavastatin Calcium 2 MG TABS Take 1 tablet (2 mg total) by mouth daily.   potassium chloride SA (KLOR-CON M) 20 MEQ tablet Take 1 tablet (20 mEq total) by mouth 2 (two) times daily.   sertraline (ZOLOFT) 50 MG tablet Take 1 tablet (50 mg total) by mouth daily.   topiramate (TOPAMAX)  50 MG tablet TAKE 1 TABLET BY MOUTH AT  BEDTIME     Allergies:   Simvastatin   Social History   Socioeconomic History   Marital status: Married    Spouse name: Not on file   Number of children: Not on file   Years of education: Not on file   Highest education level: Not on file  Occupational History   Not on file  Tobacco Use   Smoking status: Former   Smokeless tobacco: Current    Types: Chew   Tobacco comments:    quit 20 years ago  Vaping Use   Vaping Use: Never used  Substance and Sexual Activity   Alcohol use: Yes    Alcohol/week: 10.0 standard drinks of alcohol    Types: 10 Cans of beer per week    Comment: couple drinks a week    Drug use: No   Sexual activity: Yes    Partners: Female  Other Topics Concern   Not on file  Social History Narrative   Not on file   Social Determinants of Radio broadcast assistant  Strain: Not on file  Food Insecurity: Not on file  Transportation Needs: Not on file  Physical Activity: Not on file  Stress: Not on file  Social Connections: Not on file     Family History: The patient's family history includes Bone cancer in his sister; Colon cancer in his maternal uncle; Diabetes in his mother; Hyperlipidemia in his father; Hypertension in his mother; Stomach cancer in his maternal grandmother; Micheal Keller Parkinson White syndrome in his brother. There is no history of Esophageal cancer or Rectal cancer.  ROS:   Please see the history of present illness.     All other systems reviewed and are negative.  EKGs/Labs/Other Studies Reviewed:    The following studies were reviewed today:   EKG: July 25, 2022: Sinus bradycardia 24.  Voltage criteria for LVH.  Otherwise Normal EKG.  Recent Labs: 07/16/2022: ALT 31; BUN 15; Creatinine, Ser 1.11; Hemoglobin 14.0; Magnesium 2.2; Platelets 275.0; Potassium 3.9; Sodium 138; TSH 1.59  Recent Lipid Panel    Component Value Date/Time   CHOL 206 (H) 10/24/2021 0926   TRIG 91.0 10/24/2021  0926   HDL 40.70 10/24/2021 0926   CHOLHDL 5 10/24/2021 0926   VLDL 18.2 10/24/2021 0926   LDLCALC 147 (H) 10/24/2021 0926     Risk Assessment/Calculations:                Physical Exam:    VS:  BP 132/80   Pulse (!) 56   Ht '5\' 11"'$  (1.803 m)   Wt 215 lb 9.6 oz (97.8 kg)   SpO2 94%   BMI 30.07 kg/m     Wt Readings from Last 3 Encounters:  07/25/22 215 lb 9.6 oz (97.8 kg)  07/16/22 216 lb (98 kg)  12/24/21 209 lb (94.8 kg)     GEN:  Well nourished, well developed in no acute distress HEENT: Normal NECK: No JVD; No carotid bruits LYMPHATICS: No lymphadenopathy CARDIAC: RRR, no murmurs, rubs, gallops RESPIRATORY:  Clear to auscultation without rales, wheezing or rhonchi  ABDOMEN: Soft, non-tender, non-distended MUSCULOSKELETAL:  No edema; No deformity  SKIN: Warm and dry NEUROLOGIC:  Alert and oriented x 3 PSYCHIATRIC:  Normal affect   ASSESSMENT:    1. Bradycardia    PLAN:    In order of problems listed above:  Sinus bradycardia.  Charlis is a very healthy middle-aged gentleman.  He was very athletic in previous years.  He has not been working out quite as much recently.  He has high resting vagal tone.  He has baseline sinus bradycardia. With a 2-minute step test his heart rate increased to 132.  He did not have any dizziness.  No chest pain or shortness of breath.  This shows that his chronotropic response is intact.  His PR interval is normal.  His QRS duration is normal.  At this point Micheal Keller is very healthy and has a benign sinus bradycardia.  I have encouraged him to resume his exercise activities.           Medication Adjustments/Labs and Tests Ordered: Current medicines are reviewed at length with the patient today.  Concerns regarding medicines are outlined above.  Orders Placed This Encounter  Procedures   EKG 12-Lead   No orders of the defined types were placed in this encounter.   Patient Instructions  Medication Instructions:  Your  physician recommends that you continue on your current medications as directed. Please refer to the Current Medication list given to you today.  *If you need  a refill on your cardiac medications before your next appointment, please call your pharmacy*   Lab Work: NONE If you have labs (blood work) drawn today and your tests are completely normal, you will receive your results only by: [place] (if you have MyChart) OR A paper copy in the mail If you have any lab test that is abnormal or we need to change your treatment, we will call you to review the results.   Testing/Procedures: NONE   Follow-Up: At [facility], you and your health needs are our priority.  As part of our continuing mission to provide you with exceptional heart care, we have created designated Provider Care Teams.  These Care Teams include your primary Cardiologist (physician) and Advanced Practice Providers (APPs -  Physician Assistants and Nurse Practitioners) who all work together to provide you with the care you need, when you need it.  We recommend signing up for the patient portal called "MyChart".  Sign up information is provided on this After Visit Summary.  MyChart is used to connect with patients for Virtual Visits (Telemedicine).  Patients are able to view lab/test results, encounter notes, upcoming appointments, etc.  Non-urgent messages can be sent to your provider as well.   To learn more about what you can do with MyChart, go to [email or web address].    Your next appointment:   As needed  Provider:   Mertie Moores, MD      Signed, Mertie Moores, MD  07/25/2022 8:58 AM    [place]

## 2022-08-02 ENCOUNTER — Encounter: Payer: Self-pay | Admitting: Internal Medicine

## 2022-08-04 NOTE — Telephone Encounter (Signed)
Called [facility] wanted to verify Pitavastatin that was rx. Rep Marquette Saa states they did received, but pt had a allergy to statin . Just want to make sure ok to fill. Inform Kamara per chart pt saw MD 07/16/22 and med was rx ok to fill.Marland KitchenJohny Chess

## 2022-08-12 ENCOUNTER — Encounter: Payer: Self-pay | Admitting: Internal Medicine

## 2022-08-12 ENCOUNTER — Other Ambulatory Visit: Payer: Self-pay | Admitting: Internal Medicine

## 2022-08-12 DIAGNOSIS — I10 Essential (primary) hypertension: Secondary | ICD-10-CM

## 2022-08-12 DIAGNOSIS — E876 Hypokalemia: Secondary | ICD-10-CM

## 2022-08-12 DIAGNOSIS — G43809 Other migraine, not intractable, without status migrainosus: Secondary | ICD-10-CM

## 2022-08-12 DIAGNOSIS — T502X5A Adverse effect of carbonic-anhydrase inhibitors, benzothiadiazides and other diuretics, initial encounter: Secondary | ICD-10-CM

## 2022-08-12 DIAGNOSIS — F411 Generalized anxiety disorder: Secondary | ICD-10-CM

## 2022-08-12 MED ORDER — TOPIRAMATE 50 MG PO TABS: 50.0000 mg | ORAL_TABLET | Freq: Every day | ORAL | 1 refills | Status: DC

## 2022-08-12 MED ORDER — INDAPAMIDE 1.25 MG PO TABS: 1.2500 mg | ORAL_TABLET | Freq: Every day | ORAL | 1 refills | Status: DC

## 2022-08-12 MED ORDER — POTASSIUM CHLORIDE CRYS ER 20 MEQ PO TBCR: 20.0000 meq | EXTENDED_RELEASE_TABLET | Freq: Two times a day (BID) | ORAL | 1 refills | Status: DC

## 2022-08-12 MED ORDER — SERTRALINE HCL 50 MG PO TABS: 50.0000 mg | ORAL_TABLET | Freq: Every day | ORAL | 1 refills | Status: DC

## 2022-08-30 ENCOUNTER — Other Ambulatory Visit: Payer: Self-pay | Admitting: Internal Medicine

## 2022-08-30 DIAGNOSIS — I10 Essential (primary) hypertension: Secondary | ICD-10-CM

## 2022-08-30 DIAGNOSIS — E876 Hypokalemia: Secondary | ICD-10-CM

## 2022-12-07 ENCOUNTER — Other Ambulatory Visit: Payer: Self-pay | Admitting: Internal Medicine

## 2022-12-07 DIAGNOSIS — E785 Hyperlipidemia, unspecified: Secondary | ICD-10-CM

## 2022-12-30 ENCOUNTER — Other Ambulatory Visit: Payer: Self-pay | Admitting: Internal Medicine

## 2022-12-30 DIAGNOSIS — G43809 Other migraine, not intractable, without status migrainosus: Secondary | ICD-10-CM

## 2023-02-09 ENCOUNTER — Other Ambulatory Visit: Payer: Self-pay | Admitting: Internal Medicine

## 2023-02-09 DIAGNOSIS — I10 Essential (primary) hypertension: Secondary | ICD-10-CM

## 2023-02-09 DIAGNOSIS — T502X5A Adverse effect of carbonic-anhydrase inhibitors, benzothiadiazides and other diuretics, initial encounter: Secondary | ICD-10-CM

## 2023-03-01 ENCOUNTER — Encounter: Payer: Self-pay | Admitting: Internal Medicine

## 2023-03-07 ENCOUNTER — Other Ambulatory Visit: Payer: Self-pay | Admitting: Internal Medicine

## 2023-03-07 DIAGNOSIS — I10 Essential (primary) hypertension: Secondary | ICD-10-CM

## 2023-03-07 DIAGNOSIS — F411 Generalized anxiety disorder: Secondary | ICD-10-CM

## 2023-03-07 DIAGNOSIS — G43809 Other migraine, not intractable, without status migrainosus: Secondary | ICD-10-CM

## 2023-03-07 DIAGNOSIS — E876 Hypokalemia: Secondary | ICD-10-CM

## 2023-03-07 DIAGNOSIS — E785 Hyperlipidemia, unspecified: Secondary | ICD-10-CM

## 2023-03-12 ENCOUNTER — Telehealth: Payer: Self-pay | Admitting: Internal Medicine

## 2023-03-12 NOTE — Telephone Encounter (Signed)
Patient has picked up forms.

## 2023-03-12 NOTE — Telephone Encounter (Signed)
Am I able to get refills on the need prescriptions to get me through until the next appt you guys have available?   Indapamide ( one week left ) Topiramte ( 2 weeks left ) Setraline  ( no refill needed ) Pitvastatin ( 1 month left ) Potassium CL SR ( 1 month left )

## 2023-03-12 NOTE — Telephone Encounter (Signed)
He has to be seen 

## 2023-04-10 ENCOUNTER — Other Ambulatory Visit: Payer: Self-pay | Admitting: Internal Medicine

## 2023-04-10 DIAGNOSIS — F411 Generalized anxiety disorder: Secondary | ICD-10-CM

## 2023-04-16 ENCOUNTER — Ambulatory Visit: Payer: 59 | Admitting: Internal Medicine

## 2023-04-16 ENCOUNTER — Encounter: Payer: Self-pay | Admitting: Internal Medicine

## 2023-04-16 VITALS — BP 156/94 | HR 59 | Temp 98.2°F | Resp 16 | Ht 71.0 in | Wt 213.0 lb

## 2023-04-16 DIAGNOSIS — I119 Hypertensive heart disease without heart failure: Secondary | ICD-10-CM

## 2023-04-16 DIAGNOSIS — Z0000 Encounter for general adult medical examination without abnormal findings: Secondary | ICD-10-CM

## 2023-04-16 DIAGNOSIS — R001 Bradycardia, unspecified: Secondary | ICD-10-CM

## 2023-04-16 DIAGNOSIS — Z23 Encounter for immunization: Secondary | ICD-10-CM | POA: Diagnosis not present

## 2023-04-16 DIAGNOSIS — I10 Essential (primary) hypertension: Secondary | ICD-10-CM

## 2023-04-16 DIAGNOSIS — Z0001 Encounter for general adult medical examination with abnormal findings: Secondary | ICD-10-CM

## 2023-04-16 DIAGNOSIS — E7801 Familial hypercholesterolemia: Secondary | ICD-10-CM

## 2023-04-16 DIAGNOSIS — E785 Hyperlipidemia, unspecified: Secondary | ICD-10-CM | POA: Diagnosis not present

## 2023-04-16 DIAGNOSIS — E119 Type 2 diabetes mellitus without complications: Secondary | ICD-10-CM

## 2023-04-16 LAB — BASIC METABOLIC PANEL
BUN: 12 mg/dL (ref 6–23)
CO2: 27 meq/L (ref 19–32)
Calcium: 9.8 mg/dL (ref 8.4–10.5)
Chloride: 103 meq/L (ref 96–112)
Creatinine, Ser: 0.94 mg/dL (ref 0.40–1.50)
GFR: 94.52 mL/min (ref >60.00)
Glucose, Bld: 94 mg/dL (ref 70–99)
Potassium: 3.9 meq/L (ref 3.5–5.1)
Sodium: 138 meq/L (ref 135–145)

## 2023-04-16 LAB — CBC WITH DIFFERENTIAL/PLATELET
Basophils Absolute: 0 10*3/uL (ref 0.0–0.1)
Basophils Relative: 1.1 % (ref 0.0–3.0)
Eosinophils Absolute: 0.1 10*3/uL (ref 0.0–0.7)
Eosinophils Relative: 2.4 % (ref 0.0–5.0)
HCT: 42.3 % (ref 39.0–52.0)
Hemoglobin: 13.9 g/dL (ref 13.0–17.0)
Lymphocytes Relative: 53.8 % — ABNORMAL HIGH (ref 12.0–46.0)
Lymphs Abs: 2.4 10*3/uL (ref 0.7–4.0)
MCHC: 33 g/dL (ref 30.0–36.0)
MCV: 93.3 fL (ref 78.0–100.0)
Monocytes Absolute: 0.3 10*3/uL (ref 0.1–1.0)
Monocytes Relative: 7.1 % (ref 3.0–12.0)
Neutro Abs: 1.6 10*3/uL (ref 1.4–7.7)
Neutrophils Relative %: 35.6 % — ABNORMAL LOW (ref 43.0–77.0)
Platelets: 244 10*3/uL (ref 150.0–400.0)
RBC: 4.53 Mil/uL (ref 4.22–5.81)
RDW: 13.5 % (ref 11.5–15.5)
WBC: 4.4 10*3/uL (ref 4.0–10.5)

## 2023-04-16 LAB — MICROALBUMIN / CREATININE URINE RATIO
Creatinine,U: 118 mg/dL
Microalb Creat Ratio: 0.6 mg/g (ref 0.0–30.0)
Microalb, Ur: 0.7 mg/dL (ref 0.0–1.9)

## 2023-04-16 LAB — HEPATIC FUNCTION PANEL
ALT: 36 U/L (ref 0–53)
AST: 23 U/L (ref 0–37)
Albumin: 4.8 g/dL (ref 3.5–5.2)
Alkaline Phosphatase: 54 U/L (ref 39–117)
Bilirubin, Direct: 0.2 mg/dL (ref 0.0–0.3)
Total Bilirubin: 0.8 mg/dL (ref 0.2–1.2)
Total Protein: 7.1 g/dL (ref 6.0–8.3)

## 2023-04-16 LAB — PSA: PSA: 0.88 ng/mL (ref 0.10–4.00)

## 2023-04-16 LAB — LIPID PANEL
Cholesterol: 257 mg/dL — ABNORMAL HIGH (ref 0–200)
HDL: 42.8 mg/dL (ref >39.00)
LDL Cholesterol: 198 mg/dL — ABNORMAL HIGH (ref 0–99)
NonHDL: 214.57
Total CHOL/HDL Ratio: 6
Triglycerides: 84 mg/dL (ref 0.0–149.0)
VLDL: 16.8 mg/dL (ref 0.0–40.0)

## 2023-04-16 LAB — HEMOGLOBIN A1C: Hgb A1c MFr Bld: 6.3 % (ref 4.6–6.5)

## 2023-04-16 LAB — CK: Total CK: 142 U/L (ref 7–232)

## 2023-04-16 NOTE — Patient Instructions (Signed)
You are due for another shingles vaccine in 6 months  Health Maintenance, Male Adopting a healthy lifestyle and getting preventive care are important in promoting health and wellness. Ask your health care provider about: The right schedule for you to have regular tests and exams. Things you can do on your own to prevent diseases and keep yourself healthy. What should I know about diet, weight, and exercise? Eat a healthy diet  Eat a diet that includes plenty of vegetables, fruits, low-fat dairy products, and lean protein. Do not eat a lot of foods that are high in solid fats, added sugars, or sodium. Maintain a healthy weight Body mass index (BMI) is a measurement that can be used to identify possible weight problems. It estimates body fat based on height and weight. Your health care provider can help determine your BMI and help you achieve or maintain a healthy weight. Get regular exercise Get regular exercise. This is one of the most important things you can do for your health. Most adults should: Exercise for at least 150 minutes each week. The exercise should increase your heart rate and make you sweat (moderate-intensity exercise). Do strengthening exercises at least twice a week. This is in addition to the moderate-intensity exercise. Spend less time sitting. Even light physical activity can be beneficial. Watch cholesterol and blood lipids Have your blood tested for lipids and cholesterol at 50 years of age, then have this test every 5 years. You may need to have your cholesterol levels checked more often if: Your lipid or cholesterol levels are high. You are older than 50 years of age. You are at high risk for heart disease. What should I know about cancer screening? Many types of cancers can be detected early and may often be prevented. Depending on your health history and family history, you may need to have cancer screening at various ages. This may include screening  for: Colorectal cancer. Prostate cancer. Skin cancer. Lung cancer. What should I know about heart disease, diabetes, and high blood pressure? Blood pressure and heart disease High blood pressure causes heart disease and increases the risk of stroke. This is more likely to develop in people who have high blood pressure readings or are overweight. Talk with your health care provider about your target blood pressure readings. Have your blood pressure checked: Every 3-5 years if you are 84-36 years of age. Every year if you are 62 years old or older. If you are between the ages of 67 and 73 and are a current or former smoker, ask your health care provider if you should have a one-time screening for abdominal aortic aneurysm (AAA). Diabetes Have regular diabetes screenings. This checks your fasting blood sugar level. Have the screening done: Once every three years after age 90 if you are at a normal weight and have a low risk for diabetes. More often and at a younger age if you are overweight or have a high risk for diabetes. What should I know about preventing infection? Hepatitis B If you have a higher risk for hepatitis B, you should be screened for this virus. Talk with your health care provider to find out if you are at risk for hepatitis B infection. Hepatitis C Blood testing is recommended for: Everyone born from [birth year] through 1963. Anyone with known risk factors for hepatitis C. Sexually transmitted infections (STIs) You should be screened each year for STIs, including gonorrhea and chlamydia, if: You are sexually active and are younger than 50 years of  age. Bonita Quin are older than 50 years of age and your health care provider tells you that you are at risk for this type of infection. Your sexual activity has changed since you were last screened, and you are at increased risk for chlamydia or gonorrhea. Ask your health care provider if you are at risk. Ask your health care provider about  whether you are at high risk for HIV. Your health care provider may recommend a prescription medicine to help prevent HIV infection. If you choose to take medicine to prevent HIV, you should first get tested for HIV. You should then be tested every 3 months for as long as you are taking the medicine. Follow these instructions at home: Alcohol use Do not drink alcohol if your health care provider tells you not to drink. If you drink alcohol: Limit how much you have to 0-2 drinks a day. Know how much alcohol is in your drink. In the [place] one drink equals one 12 oz bottle of beer (355 mL), one 5 oz glass of wine (148 mL), or one 1 oz glass of hard liquor (44 mL). Lifestyle Do not use any products that contain nicotine or tobacco. These products include cigarettes, chewing tobacco, and vaping devices, such as e-cigarettes. If you need help quitting, ask your health care provider. Do not use street drugs. Do not share needles. Ask your health care provider for help if you need support or information about quitting drugs. General instructions Schedule regular health, dental, and eye exams. Stay current with your vaccines. Tell your health care provider if: You often feel depressed. You have ever been abused or do not feel safe at home. Summary Adopting a healthy lifestyle and getting preventive care are important in promoting health and wellness. Follow your health care provider's instructions about healthy diet, exercising, and getting tested or screened for diseases. Follow your health care provider's instructions on monitoring your cholesterol and blood pressure. This information is not intended to replace advice given to you by your health care provider. Make sure you discuss any questions you have with your health care provider. Document Revised: 11/12/2020 Document Reviewed: 11/12/2020 Elsevier Patient Education  2024 [employer].

## 2023-04-16 NOTE — Progress Notes (Signed)
Subjective:  Patient ID: Micheal Keller, male    DOB: Nov 14, 1970  Age: 52 y.o. MRN: [id number]  CC: Annual Exam, Hypertension, Hyperlipidemia, and Diabetes   HPI Micheal Keller presents for a CPX and f/up ----  Discussed the use of AI scribe software for clinical note transcription with the patient, who gave verbal consent to proceed.  History of Present Illness   The patient, with a history of hypertension, hyperlipidemia, and alcohol use, reports increased urinary frequency but denies any symptoms of diabetes such as excessive thirst. He has not been monitoring his blood sugar or blood pressure at home. He describes himself as fairly active, engaging in outdoor activities without experiencing any chest pain or shortness of breath. His last eye exam was a year ago and is due for the current year. He has a family history of colon and prostate cancer.  The patient admits to alcohol consumption but has been abstinent for the past two weeks and is currently seeking therapy for alcohol use. He denies any current muscle aches, a previous issue thought to be related to his cholesterol medication, pitavastatin, which he continues to take.       Outpatient Medications Prior to Visit  Medication Sig Dispense Refill   amitriptyline (ELAVIL) 25 MG tablet TAKE 1 TABLET BY MOUTH  DAILY AT BEDTIME 90 tablet 1   BEPREVE 1.5 % SOLN 1 drop 2 (two) times daily.     cetirizine (ZYRTEC) 10 MG tablet Take 10 mg by mouth daily.     DYMISTA 137-50 MCG/ACT SUSP      eletriptan (RELPAX) 40 MG tablet TAKE 1 TABLET AS NEEDED FORMIGRAINE MAY REPEAT IN 2   HOURS IF NECESSARY 10 tablet 2   Multiple Vitamin (MULTIVITAMIN ADULT PO) Take by mouth daily.     sertraline (ZOLOFT) 50 MG tablet TAKE 1 TABLET BY MOUTH DAILY 30 tablet 0   topiramate (TOPAMAX) 50 MG tablet Take 1 tablet (50 mg total) by mouth at bedtime. 90 tablet 1   indapamide (LOZOL) 1.25 MG tablet Take 1 tablet (1.25 mg total) by mouth daily. 90 tablet 1    Pitavastatin Calcium 2 MG TABS TAKE 1 TABLET BY MOUTH DAILY 90 tablet 0   potassium chloride SA (KLOR-CON M) 20 MEQ tablet TAKE 1 TABLET BY MOUTH TWICE DAILY 180 tablet 1   No facility-administered medications prior to visit.    ROS Review of Systems  Constitutional:  Negative for appetite change, chills, diaphoresis, fatigue and fever.  HENT: Negative.    Eyes:  Positive for visual disturbance.  Respiratory:  Negative for cough, chest tightness, shortness of breath and wheezing.   Cardiovascular:  Negative for chest pain, palpitations and leg swelling.  Gastrointestinal:  Negative for abdominal pain, constipation, diarrhea, nausea and vomiting.  Endocrine: Positive for polydipsia. Negative for polyphagia and polyuria.  Genitourinary: Negative.  Negative for difficulty urinating.  Musculoskeletal: Negative.  Negative for arthralgias and myalgias.  Skin: Negative.   Neurological: Negative.  Negative for dizziness, weakness, light-headedness and numbness.  Hematological:  Negative for adenopathy. Does not bruise/bleed easily.  Psychiatric/Behavioral: Negative.      Objective:  BP (!) 156/94 (BP Location: Left Arm, Patient Position: Sitting, Cuff Size: Large)   Pulse (!) 59   Temp 98.2 F (36.8 C) (Oral)   Resp 16   Ht 5\' 11"  (1.803 m)   Wt 213 lb (96.6 kg)   SpO2 97%   BMI 29.71 kg/m   BP Readings from Last 3 Encounters:  04/16/23 Marland Kitchen)  156/94  07/25/22 132/80  07/16/22 138/88    Wt Readings from Last 3 Encounters:  04/16/23 213 lb (96.6 kg)  07/25/22 215 lb 9.6 oz (97.8 kg)  07/16/22 216 lb (98 kg)    Physical Exam Vitals reviewed.  HENT:     Nose: Nose normal.     Mouth/Throat:     Mouth: Mucous membranes are moist.  Eyes:     General: No scleral icterus.    Conjunctiva/sclera: Conjunctivae normal.  Cardiovascular:     Rate and Rhythm: Regular rhythm. Bradycardia present. No extrasystoles are present.    Heart sounds: Normal heart sounds, S1 normal and S2  normal. No murmur heard.    No gallop.     Comments: EKG- SB, 57 bpm LAE LVH No Q waves or acute ST/T wave changes Unchanged Pulmonary:     Effort: Pulmonary effort is normal.     Breath sounds: No stridor. No wheezing, rhonchi or rales.  Abdominal:     General: Abdomen is flat.     Palpations: There is no mass.     Tenderness: There is no abdominal tenderness. There is no guarding.     Hernia: No hernia is present. There is no hernia in the left inguinal area or right inguinal area.  Genitourinary:    Pubic Area: No rash.      Penis: Normal and uncircumcised.      Testes: Normal.     Epididymis:     Right: Normal.     Left: Normal.     Prostate: Normal. Not enlarged, not tender and no nodules present.     Rectum: Guaiac result negative. External hemorrhoid and internal hemorrhoid present. No mass, tenderness or anal fissure. Normal anal tone.  Musculoskeletal:     Cervical back: Neck supple.     Right lower leg: No edema.     Left lower leg: No edema.  Lymphadenopathy:     Cervical: No cervical adenopathy.     Lower Body: No right inguinal adenopathy. No left inguinal adenopathy.  Skin:    General: Skin is warm.     Findings: No rash.  Neurological:     General: No focal deficit present.     Mental Status: He is alert. Mental status is at baseline.  Psychiatric:        Mood and Affect: Mood normal.        Behavior: Behavior normal.     Lab Results  Component Value Date   WBC 4.4 04/16/2023   HGB 13.9 04/16/2023   HCT 42.3 04/16/2023   PLT 244.0 04/16/2023   GLUCOSE 94 04/16/2023   CHOL 257 (H) 04/16/2023   TRIG 84.0 04/16/2023   HDL 42.80 04/16/2023   LDLCALC 198 (H) 04/16/2023   ALT 36 04/16/2023   AST 23 04/16/2023   NA 138 04/16/2023   K 3.9 04/16/2023   CL 103 04/16/2023   CREATININE 0.94 04/16/2023   BUN 12 04/16/2023   CO2 27 04/16/2023   TSH 1.59 07/16/2022   PSA 0.88 04/16/2023   HGBA1C 6.3 04/16/2023   MICROALBUR 0.7 04/16/2023    MR  FINGERS LEFT W/O CM  Result Date: 08/03/2018 CLINICAL DATA:  Left long finger pain since the patient suffered an injury attempting to catch a football 2-1/2 months ago. Question volar plate injury at the PIP joint of the left long finger. Initial encounter. EXAM: MRI OF THE LEFT FINGERS WITHOUT CONTRAST TECHNIQUE: Multiplanar, multisequence MR imaging of the left fingers was performed.  No intravenous contrast was administered. COMPARISON:  None. FINDINGS: Bones/Joint/Cartilage There is marrow edema in the neck of the proximal phalanx of the long finger consistent with contusion. No fracture is identified. Ligaments The radial collateral ligament is torn from its attachment to the proximal phalanx of the long finger. The accessory collateral ligament on the radial side is also torn from the proximal phalanx of the long finger. The volar plate remains attached to the base of the middle phalanx of the long finger but the attachment is attenuated consistent with partial tear. The volar plate appears irregular and edematous. No pulley injury is identified. Muscles and Tendons Intact. Soft tissues Edema about the PIP joint of the long fingers worse on the radial side. IMPRESSION: Tears of both the radial collateral ligament and accessory radial collateral ligament from the proximal phalanx of the long finger. Partial tear of the volar plate from the base of the middle phalanx of the long finger. The volar plate is attenuated and edematous consistent with contusion. Edema in the distal aspect of the proximal phalanx of the long finger consistent with contusion. Electronically Signed   By: Drusilla Kanner M.D.   On: 08/03/2018 10:01    Assessment & Plan:  Flu vaccine need -     Flu vaccine trivalent PF, 6mos and older(Flulaval,Afluria,Fluarix,Fluzone)  Primary hypertension- He has not achieved his BP goal and is symptomatic. -     CBC with Differential/Platelet; Future -     Urinalysis, Routine w reflex  microscopic; Future -     Hepatic function panel; Future -     Basic metabolic panel; Future -     EKG 12-Lead -     Aldosterone + renin activity w/ ratio; Future -     Triamterene-HCTZ; Take 1 each (1 capsule total) by mouth daily.  Dispense: 90 capsule; Refill: 0 -     AMB Referral to Pharmacy Medication Management  Encounter for general adult medical examination with abnormal findings- Exam completed, labs reviewed, vaccines reviewed and updated, cancer screenings addressed, pt ed material was given.  -     PSA; Future  Hyperlipidemia LDL goal <130- He has not achieved his LDL goal. -     Lipid panel; Future -     Hepatic function panel; Future -     CK; Future -     CT CARDIAC SCORING (SELF PAY ONLY); Future -     Rosuvastatin Calcium; Take 1 tablet (20 mg total) by mouth daily.  Dispense: 90 tablet; Refill: 0 -     Nexlizet; Take 1 tablet by mouth daily.  Dispense: 90 tablet; Refill: 0 -     AMB Referral to Pharmacy Medication Management  Type 2 diabetes mellitus without complication, without long-term current use of insulin (HCC) -     Urinalysis, Routine w reflex microscopic; Future -     Hemoglobin A1c; Future -     Microalbumin / creatinine urine ratio; Future -     Basic metabolic panel; Future -     Ambulatory referral to Ophthalmology -     HM Diabetes Foot Exam  Bradycardia- He is asx with this. -     EKG 12-Lead  Hypertensive left ventricular hypertrophy, without heart failure -     Aldosterone + renin activity w/ ratio; Future -     CT CARDIAC SCORING (SELF PAY ONLY); Future -     AMB Referral to Pharmacy Medication Management  Familial hypercholesterolemia -     Nexlizet; Take  1 tablet by mouth daily.  Dispense: 90 tablet; Refill: 0 -     AMB Referral to Pharmacy Medication Management  Other orders -     Varicella-zoster vaccine IM     Follow-up: Return in about 6 months (around 10/15/2023).  Sanda Linger, MD

## 2023-04-17 DIAGNOSIS — E7801 Familial hypercholesterolemia: Secondary | ICD-10-CM | POA: Insufficient documentation

## 2023-04-17 DIAGNOSIS — Z23 Encounter for immunization: Secondary | ICD-10-CM | POA: Diagnosis not present

## 2023-04-17 DIAGNOSIS — E78019 Familial hypercholesterolemia, unspecified: Secondary | ICD-10-CM | POA: Insufficient documentation

## 2023-04-17 MED ORDER — ROSUVASTATIN CALCIUM 20 MG PO TABS
20.0000 mg | ORAL_TABLET | Freq: Every day | ORAL | 0 refills | Status: DC
Start: 2023-04-17 — End: 2023-06-23

## 2023-04-17 MED ORDER — ROSUVASTATIN CALCIUM 20 MG PO TABS
20.0000 mg | ORAL_TABLET | Freq: Every day | ORAL | 0 refills | Status: DC
Start: 2023-04-17 — End: 2023-04-17

## 2023-04-17 MED ORDER — TRIAMTERENE-HCTZ 37.5-25 MG PO CAPS
1.0000 | ORAL_CAPSULE | Freq: Every day | ORAL | 0 refills | Status: DC
Start: 2023-04-17 — End: 2023-07-20

## 2023-04-17 MED ORDER — NEXLIZET 180-10 MG PO TABS
1.0000 | ORAL_TABLET | Freq: Every day | ORAL | 0 refills | Status: DC
Start: 2023-04-17 — End: 2023-07-20

## 2023-04-20 ENCOUNTER — Telehealth: Payer: Self-pay

## 2023-04-20 NOTE — Progress Notes (Signed)
   Care Guide Note  04/20/2023 Name: Micheal Keller MRN: [id number] DOB: May 12, 1971  Referred by: Etta Grandchild, MD Reason for referral : Care Coordination (Outreach to schedule with Pharm d )   Micheal Keller is a 52 y.o. year old male who is a primary care patient of Etta Grandchild, MD. [name] was referred to the pharmacist for assistance related to HTN and HLD.    Successful contact was made with the patient to discuss pharmacy services including being ready for the pharmacist to call at least 5 minutes before the scheduled appointment time, to have medication bottles and any blood sugar or blood pressure readings ready for review. The patient agreed to meet with the pharmacist via with the pharmacist via telephone visit on (date/time).  04/22/2023  Penne Lash, RMA Care Guide [facility]  [place], [place] [address] Direct Dial: [phone] [email or web address]

## 2023-04-22 ENCOUNTER — Other Ambulatory Visit: Payer: Self-pay | Admitting: Internal Medicine

## 2023-04-22 ENCOUNTER — Other Ambulatory Visit: Payer: 59 | Admitting: Pharmacist

## 2023-04-22 DIAGNOSIS — F411 Generalized anxiety disorder: Secondary | ICD-10-CM

## 2023-04-22 DIAGNOSIS — E7801 Familial hypercholesterolemia: Secondary | ICD-10-CM

## 2023-04-22 DIAGNOSIS — I10 Essential (primary) hypertension: Secondary | ICD-10-CM

## 2023-04-22 DIAGNOSIS — G43809 Other migraine, not intractable, without status migrainosus: Secondary | ICD-10-CM

## 2023-04-22 NOTE — Progress Notes (Unsigned)
04/23/2023 Name: Micheal Keller MRN: [id number] DOB: 1971-04-25  Chief Complaint  Patient presents with   Hyperlipidemia   Hypertension   Medication Management    Micheal Keller is a 52 y.o. year old male who presented for a telephone visit.   They were referred to the pharmacist by their PCP for assistance in managing hypertension and hyperlipidemia.    Subjective:  Care Team: Primary Care Provider: Etta Grandchild, MD ; Next Scheduled Visit: 07/20/2023  Medication Access/Adherence  Current Pharmacy:  [facility] - [place], [place] - [address] [place] [address] Phone: [phone] Fax: [phone]  [facility] - [place], [place] - [address] [place] [address] Phone: [phone] Fax: [phone]   Patient reports affordability concerns with their medications: No  Patient reports access/transportation concerns to their pharmacy: No  Patient reports adherence concerns with their medications:  No     Hypertension:  Current medications: triamterene/hydrochlorothiazide 37.5/25 mg daily (Started 10/11) Pt did stop potassium chloride when he started triamterene/HCTZ  Patient has a validated, automated, upper arm home BP cuff Current blood pressure readings readings: Checked BP a couple days ago was 127/75 (after starting new med)  BP Readings from Last 3 Encounters:  04/16/23 (!) 156/94  07/25/22 132/80  07/16/22 138/88     Patient reports hypotensive s/sx including dizziness, lightheadedness. Occurred one time after starting triamterene/hydrochlorothiazide and Nexlizet - he thought it was due to taking Nexlizet plus pitavastatin so he stopped pitavastatin. No issues since. Patient denies hypertensive symptoms including headache, chest pain, shortness of breath  He reports weight/diet/physical activity has remained the same  Hyperlipidemia/ASCVD Risk Reduction  Current lipid  lowering medications: Nexlizet 180-10 mg daily (started 10/11), rosuvastatin 20 mg daily was prescribed but has not started yet (pharmacy didn't give to him when he picked up other two new medications) Medications tried in the past: pitavastatin (not effective), simvastatin (myalgia)  The 10-year ASCVD risk score (Arnett DK, et al., 2019) is: 25.1%   Values used to calculate the score:     Age: 46 years     Sex: Male     Is Non-Hispanic African American: Yes     Diabetic: Yes     Tobacco smoker: No     Systolic Blood Pressure: 156 mmHg     Is BP treated: Yes     HDL Cholesterol: 42.8 mg/dL     Total Cholesterol: 257 mg/dL     Objective:  Lab Results  Component Value Date   HGBA1C 6.3 04/16/2023    Lab Results  Component Value Date   CREATININE 0.94 04/16/2023   BUN 12 04/16/2023   NA 138 04/16/2023   K 3.9 04/16/2023   CL 103 04/16/2023   CO2 27 04/16/2023    Lab Results  Component Value Date   CHOL 257 (H) 04/16/2023   HDL 42.80 04/16/2023   LDLCALC 198 (H) 04/16/2023   TRIG 84.0 04/16/2023   CHOLHDL 6 04/16/2023    Medications Reviewed Today     Reviewed by Bonita Quin, RPH (Pharmacist) on 04/22/23 at 1113  Med List Status: <None>   Medication Order Taking? Sig Documenting Provider Last Dose Status Informant  Bempedoic Acid-Ezetimibe (NEXLIZET) 180-10 MG TABS [id number] Yes Take 1 tablet by mouth daily. Etta Grandchild, MD Taking Active   DYMISTA 137-50 MCG/ACT Santina Evans [id number] Yes  [provider] Taking Active   eletriptan (RELPAX) 40 MG  tablet [id number] Yes TAKE 1 TABLET AS NEEDED FORMIGRAINE MAY REPEAT IN 2   HOURS IF NECESSARY Cirigliano, Mary K, DO Taking Active   fexofenadine (ALLEGRA) 180 MG tablet [id number] Yes Take 180 mg by mouth daily. [provider] Taking Active Self  Multiple Vitamin (MULTIVITAMIN ADULT PO) [id number] Yes Take by mouth daily. [provider] Taking Active   olopatadine (PATADAY) 0.1 % ophthalmic  solution [id number] Yes Place 1 drop into both eyes daily. [provider]  Active   rosuvastatin (CRESTOR) 20 MG tablet [id number] No Take 1 tablet (20 mg total) by mouth daily.  Patient not taking: Reported on 04/22/2023   Etta Grandchild, MD Not Taking Active   sertraline (ZOLOFT) 50 MG tablet [id number] Yes TAKE 1 TABLET BY MOUTH DAILY Etta Grandchild, MD Taking Active   topiramate (TOPAMAX) 50 MG tablet [id number] Yes Take 1 tablet (50 mg total) by mouth at bedtime. Etta Grandchild, MD Taking Active   triamterene-hydrochlorothiazide (DYAZIDE) 37.5-25 MG capsule [id number] Yes Take 1 each (1 capsule total) by mouth daily. Etta Grandchild, MD Taking Active               Assessment/Plan:   Hypertension: - Currently controlled. BP goal <130/80. Recent BP at home was much improved and at goal. - Reviewed long term cardiovascular and renal outcomes of uncontrolled blood pressure - Reviewed appropriate blood pressure monitoring technique and reviewed goal blood pressure. Recommended to check home blood pressure and heart rate several times per week over the next few weeks. - Recommend to continue triamterene/hydrochlorothiazide daily, keep BP log, and come for BMP the week of 10/28 to check potassium and renal function    Hyperlipidemia/ASCVD Risk Reduction: - Currently uncontrolled. LDL goal <70 due to T2DM per ADA guidelines. - Reviewed long term complications of uncontrolled cholesterol - Recommend to pick up and start rosuvastatin with Nexlizet. If he experiences lightheadedness after starting rosuvastatin, recommend stopping Nexlizet and continuing rosuvastatin to see if he tolerates it alone. Rosuvastatin will reduce LDL more than Nexlizet alone - PCP f/u with lipid panel check already scheduled for January    Follow Up Plan: 10/28 walk in lab, 11/1 telephone follow up  Arbutus Leas, PharmD, BCPS [facility] [phone]

## 2023-04-23 NOTE — Patient Instructions (Addendum)
It was a pleasure speaking with you!  Start rosuvastatin 20 mg daily with the Nexlizet you already started. If you experience dizziness after starting rosuvastatin with Nexlizet (like you had with pitavastatin), stop Nexlizet and continue rosuvastatin if tolerable.   Continue monitoring blood pressure at home at least a few times per week. Keep a log of your blood pressures to go over when I give you a call on 11/1.   Come for blood work any day 10/28-10/31 on the first floor of [facility]. No appointment needed and you do not need to be fasting.  Feel free to give me a call if you have any questions or concerns.  Arbutus Leas, PharmD, BCPS [facility] [phone]

## 2023-04-24 LAB — ALDOSTERONE + RENIN ACTIVITY W/ RATIO
ALDO / PRA Ratio: 10.5 {ratio} (ref 0.9–28.9)
Aldosterone: 6 ng/dL
Renin Activity: 0.57 ng/mL/h (ref 0.25–5.82)

## 2023-04-25 ENCOUNTER — Other Ambulatory Visit: Payer: Self-pay | Admitting: Internal Medicine

## 2023-04-25 DIAGNOSIS — I119 Hypertensive heart disease without heart failure: Secondary | ICD-10-CM

## 2023-05-07 ENCOUNTER — Other Ambulatory Visit: Payer: Self-pay | Admitting: Pharmacist

## 2023-05-07 DIAGNOSIS — I119 Hypertensive heart disease without heart failure: Secondary | ICD-10-CM

## 2023-05-08 ENCOUNTER — Other Ambulatory Visit (INDEPENDENT_AMBULATORY_CARE_PROVIDER_SITE_OTHER): Payer: 59 | Admitting: Pharmacist

## 2023-05-08 ENCOUNTER — Other Ambulatory Visit: Payer: 59

## 2023-05-08 DIAGNOSIS — I10 Essential (primary) hypertension: Secondary | ICD-10-CM

## 2023-05-08 DIAGNOSIS — E785 Hyperlipidemia, unspecified: Secondary | ICD-10-CM

## 2023-05-08 NOTE — Progress Notes (Signed)
05/08/2023 Name: Micheal Keller MRN: [id number] DOB: 1970-12-10  Chief Complaint  Patient presents with   Hypertension   Medication Management    Micheal Keller is a 52 y.o. year old male who presented for a telephone visit.   They were referred to the pharmacist by their PCP for assistance in managing hypertension and hyperlipidemia.    Subjective:  Care Team: Primary Care Provider: Etta Grandchild, MD ; Next Scheduled Visit: 07/20/2023  Medication Access/Adherence  Current Pharmacy:  [facility] - [place], [place] - [address] [place] [address] Phone: [phone] Fax: [phone]  [facility] - [place], [place] - [address] [place] [address] Phone: [phone] Fax: [phone]   Patient reports affordability concerns with their medications: No  Patient reports access/transportation concerns to their pharmacy: No  Patient reports adherence concerns with their medications:  No     Hypertension:  Current medications: triamterene/hydrochlorothiazide 37.5/25 mg daily (Started 10/11) Pt did stop potassium chloride when he started triamterene/hydrochlorothiazide  *Pt tried to come for labs yesterday but for some reason the order was not in  Patient has a validated, automated, upper arm home BP cuff Current blood pressure readings readings: 127/75, 133/84 134/90 (today)  BP Readings from Last 3 Encounters:  04/16/23 (!) 156/94  07/25/22 132/80  07/16/22 138/88     Patient denies hypotensive s/sx including dizziness, lightheadedness. Patient denies hypertensive symptoms including headache, chest pain, shortness of breath  He reports weight/diet/physical activity has remained the same He reports adding some salt to foods but not high amounts.  Hyperlipidemia/ASCVD Risk Reduction  Current lipid lowering medications: Nexlizet 180-10 mg daily (started 10/11), rosuvastatin 20 mg  daily  Medications tried in the past: pitavastatin (not effective), simvastatin (myalgia)  The 10-year ASCVD risk score (Arnett DK, et al., 2019) is: 25.1%   Values used to calculate the score:     Age: 29 years     Sex: Male     Is Non-Hispanic African American: Yes     Diabetic: Yes     Tobacco smoker: No     Systolic Blood Pressure: 156 mmHg     Is BP treated: Yes     HDL Cholesterol: 42.8 mg/dL     Total Cholesterol: 257 mg/dL     Objective:  Lab Results  Component Value Date   HGBA1C 6.3 04/16/2023    Lab Results  Component Value Date   CREATININE 0.94 04/16/2023   BUN 12 04/16/2023   NA 138 04/16/2023   K 3.9 04/16/2023   CL 103 04/16/2023   CO2 27 04/16/2023    Lab Results  Component Value Date   CHOL 257 (H) 04/16/2023   HDL 42.80 04/16/2023   LDLCALC 198 (H) 04/16/2023   TRIG 84.0 04/16/2023   CHOLHDL 6 04/16/2023    Medications Reviewed Today     Reviewed by Bonita Quin, RPH (Pharmacist) on 05/08/23 at 1406  Med List Status: <None>   Medication Order Taking? Sig Documenting Provider Last Dose Status Informant  Bempedoic Acid-Ezetimibe (NEXLIZET) 180-10 MG TABS [id number] Yes Take 1 tablet by mouth daily. Etta Grandchild, MD Taking Active   DYMISTA 137-50 MCG/ACT SUSP [id number]   [provider]  Active   eletriptan (RELPAX) 40 MG tablet [id number]  TAKE 1 TABLET AS NEEDED FORMIGRAINE MAY REPEAT IN 2   HOURS IF NECESSARY Cirigliano, Mary K, DO  Active   fexofenadine (ALLEGRA)  180 MG tablet [id number]  Take 180 mg by mouth daily. [provider]  Active Self  Multiple Vitamin (MULTIVITAMIN ADULT PO) [id number]  Take by mouth daily. [provider]  Active   olopatadine (PATADAY) 0.1 % ophthalmic solution [id number]  Place 1 drop into both eyes daily. [provider]  Active   rosuvastatin (CRESTOR) 20 MG tablet [id number] Yes Take 1 tablet (20 mg total) by mouth daily. Etta Grandchild, MD Taking Active   sertraline  (ZOLOFT) 50 MG tablet [id number]  TAKE 1 TABLET BY MOUTH DAILY Etta Grandchild, MD  Active   topiramate (TOPAMAX) 50 MG tablet [id number]  TAKE 1 TABLET BY MOUTH AT  BEDTIME Etta Grandchild, MD  Active   triamterene-hydrochlorothiazide (DYAZIDE) 37.5-25 MG capsule [id number] Yes Take 1 each (1 capsule total) by mouth daily. Etta Grandchild, MD Taking Active               Assessment/Plan:   Hypertension: - Currently uncontrolled. BP goal <130/80.  - Reviewed long term cardiovascular and renal outcomes of uncontrolled blood pressure - Reviewed appropriate blood pressure monitoring technique and reviewed goal blood pressure. Recommended to check home blood pressure and heart rate several times per week over the next few weeks. - Recommend to continue triamterene/hydrochlorothiazide daily keep BP log, and come for BMP next week to check potassium and renal function    Hyperlipidemia/ASCVD Risk Reduction: - Currently uncontrolled. LDL goal <70 due to T2DM per ADA guidelines. - Reviewed long term complications of uncontrolled cholesterol - Continue Nexlizet and rosuvastatin - PCP f/u with lipid panel check already scheduled for January    Follow Up Plan: 11/7 or 11/8 lab  Arbutus Leas, PharmD, BCPS [facility] [phone]

## 2023-05-14 ENCOUNTER — Other Ambulatory Visit (INDEPENDENT_AMBULATORY_CARE_PROVIDER_SITE_OTHER): Payer: 59

## 2023-05-14 ENCOUNTER — Other Ambulatory Visit: Payer: 59

## 2023-05-14 VITALS — BP 134/90

## 2023-05-14 DIAGNOSIS — I119 Hypertensive heart disease without heart failure: Secondary | ICD-10-CM

## 2023-05-14 DIAGNOSIS — I10 Essential (primary) hypertension: Secondary | ICD-10-CM

## 2023-05-14 LAB — BASIC METABOLIC PANEL
BUN: 13 mg/dL (ref 6–23)
CO2: 27 meq/L (ref 19–32)
Calcium: 9.9 mg/dL (ref 8.4–10.5)
Chloride: 104 meq/L (ref 96–112)
Creatinine, Ser: 1.08 mg/dL (ref 0.40–1.50)
GFR: 79.97 mL/min (ref >60.00)
Glucose, Bld: 95 mg/dL (ref 70–99)
Potassium: 3.8 meq/L (ref 3.5–5.1)
Sodium: 140 meq/L (ref 135–145)

## 2023-05-14 NOTE — Progress Notes (Signed)
05/14/2023 Name: Micheal Keller MRN: [id number] DOB: 1970/11/13  Chief Complaint  Patient presents with   Hypertension   Medication Management    Micheal Keller is a 52 y.o. year old male who presented for a telephone visit.   They were referred to the pharmacist by their PCP for assistance in managing hypertension and hyperlipidemia.    Subjective:  Care Team: Primary Care Provider: Etta Grandchild, MD ; Next Scheduled Visit: 07/20/2023  Medication Access/Adherence  Current Pharmacy:  [facility] - [place], [place] - [address] [place] [address] Phone: [phone] Fax: [phone]  [facility] - [place], [place] - [address] [place] [address] Phone: [phone] Fax: [phone]   Patient reports affordability concerns with their medications: No  Patient reports access/transportation concerns to their pharmacy: No  Patient reports adherence concerns with their medications:  No     Hypertension:  Current medications: triamterene/hydrochlorothiazide 37.5/25 mg daily (Started 10/11) Pt did stop potassium chloride when he started triamterene/hydrochlorothiazide  Patient has a validated, automated, upper arm home BP cuff Current blood pressure readings readings: 127/75, 133/84 134/90 (today)  BP Readings from Last 3 Encounters:  05/08/23 (!) 134/90  04/16/23 (!) 156/94  07/25/22 132/80      Patient denies hypotensive s/sx including dizziness, lightheadedness. Patient denies hypertensive symptoms including headache, chest pain, shortness of breath  He reports weight/diet/physical activity has remained the same He reports adding some salt to foods but not high amounts.  Hyperlipidemia/ASCVD Risk Reduction  Current lipid lowering medications: Nexlizet 180-10 mg daily (started 10/11), rosuvastatin 20 mg daily  Medications tried in the past: pitavastatin (not effective),  simvastatin (myalgia)  The 10-year ASCVD risk score (Arnett DK, et al., 2019) is: 25.1%   Values used to calculate the score:     Age: 52 years     Sex: Male     Is Non-Hispanic African American: Yes     Diabetic: Yes     Tobacco smoker: No     Systolic Blood Pressure: 156 mmHg     Is BP treated: Yes     HDL Cholesterol: 42.8 mg/dL     Total Cholesterol: 257 mg/dL     Objective:  Lab Results  Component Value Date   HGBA1C 6.3 04/16/2023    Lab Results  Component Value Date   CREATININE 1.08 05/14/2023   BUN 13 05/14/2023   NA 140 05/14/2023   K 3.8 05/14/2023   CL 104 05/14/2023   CO2 27 05/14/2023    Lab Results  Component Value Date   CHOL 257 (H) 04/16/2023   HDL 42.80 04/16/2023   LDLCALC 198 (H) 04/16/2023   TRIG 84.0 04/16/2023   CHOLHDL 6 04/16/2023    Medications Reviewed Today   Medications were not reviewed in this encounter      Assessment/Plan:   Hypertension: - Currently uncontrolled. BP goal <130/80.  - Reviewed long term cardiovascular and renal outcomes of uncontrolled blood pressure - Reviewed appropriate blood pressure monitoring technique and reviewed goal blood pressure. Recommended to check home blood pressure and heart rate several times per week over the next couple of months - BMP WNL/stable - Pt would like to wait until January appt to make further changes - Recommend to continue triamterene/hydrochlorothiazide daily keep BP log, and come for PCP f/u in January - Consider amlodipine 5 mg daily if BP remains >130/80   Hyperlipidemia/ASCVD Risk Reduction: -  Currently uncontrolled. LDL goal <70 due to T2DM per ADA guidelines. - Reviewed long term complications of uncontrolled cholesterol - Continue Nexlizet and rosuvastatin - PCP f/u with lipid panel check already scheduled for January    Follow Up Plan: 2 months  Arbutus Leas, PharmD, BCPS [facility] [phone]

## 2023-05-14 NOTE — Patient Instructions (Addendum)
It was a pleasure speaking with you today!  Continue current medications and checking blood pressure at home.  Contact me if you are seeing frequent readings in the 140/90s or higher.  Feel free to call with any questions or concerns!  Arbutus Leas, PharmD, BCPS [facility] Clinical Pharmacist [facility] [phone]

## 2023-06-17 ENCOUNTER — Other Ambulatory Visit: Payer: Self-pay | Admitting: Internal Medicine

## 2023-06-17 DIAGNOSIS — G43809 Other migraine, not intractable, without status migrainosus: Secondary | ICD-10-CM

## 2023-06-23 ENCOUNTER — Other Ambulatory Visit: Payer: Self-pay | Admitting: Internal Medicine

## 2023-06-23 DIAGNOSIS — E785 Hyperlipidemia, unspecified: Secondary | ICD-10-CM

## 2023-07-20 ENCOUNTER — Ambulatory Visit (INDEPENDENT_AMBULATORY_CARE_PROVIDER_SITE_OTHER): Payer: Self-pay | Admitting: Internal Medicine

## 2023-07-20 ENCOUNTER — Telehealth: Payer: Self-pay

## 2023-07-20 VITALS — BP 144/92 | HR 59 | Temp 98.1°F | Resp 16 | Ht 71.0 in | Wt 214.4 lb

## 2023-07-20 DIAGNOSIS — I10 Essential (primary) hypertension: Secondary | ICD-10-CM

## 2023-07-20 DIAGNOSIS — E785 Hyperlipidemia, unspecified: Secondary | ICD-10-CM

## 2023-07-20 DIAGNOSIS — E119 Type 2 diabetes mellitus without complications: Secondary | ICD-10-CM

## 2023-07-20 DIAGNOSIS — F411 Generalized anxiety disorder: Secondary | ICD-10-CM

## 2023-07-20 DIAGNOSIS — E7801 Familial hypercholesterolemia: Secondary | ICD-10-CM

## 2023-07-20 LAB — BASIC METABOLIC PANEL
BUN: 10 mg/dL (ref 6–23)
CO2: 27 meq/L (ref 19–32)
Calcium: 9.5 mg/dL (ref 8.4–10.5)
Chloride: 103 meq/L (ref 96–112)
Creatinine, Ser: 1.08 mg/dL (ref 0.40–1.50)
GFR: 79.87 mL/min (ref >60.00)
Glucose, Bld: 102 mg/dL — ABNORMAL HIGH (ref 70–99)
Potassium: 3.8 meq/L (ref 3.5–5.1)
Sodium: 139 meq/L (ref 135–145)

## 2023-07-20 LAB — HEMOGLOBIN A1C: Hgb A1c MFr Bld: 6.6 % — ABNORMAL HIGH (ref 4.6–6.5)

## 2023-07-20 LAB — CK: Total CK: 2109 U/L — ABNORMAL HIGH (ref 7–232)

## 2023-07-20 MED ORDER — ROSUVASTATIN CALCIUM 20 MG PO TABS
20.0000 mg | ORAL_TABLET | Freq: Every day | ORAL | 1 refills | Status: DC
Start: 2023-07-20 — End: 2023-08-31

## 2023-07-20 MED ORDER — SERTRALINE HCL 50 MG PO TABS: 50.0000 mg | ORAL_TABLET | Freq: Every day | ORAL | 1 refills | Status: DC

## 2023-07-20 MED ORDER — TRIAMTERENE-HCTZ 37.5-25 MG PO CAPS: 1.0000 | ORAL_CAPSULE | Freq: Every day | ORAL | 0 refills | Status: DC

## 2023-07-20 MED ORDER — NEXLIZET 180-10 MG PO TABS: 1.0000 | ORAL_TABLET | Freq: Every day | ORAL | 1 refills | Status: DC

## 2023-07-20 NOTE — Telephone Encounter (Signed)
 HIGH VALUE STICKER  HIGH VALUE: CK is 2109  RECEIVER (on-site recipient of call): Jazz  DATE & TIME NOTIFIED: 07/20/2023 at 4:03pm   MESSENGER (representative from lab): Hope from [facility]   MD NOTIFIED: Yes  TIME OF NOTIFICATION: 4:04 PM

## 2023-07-20 NOTE — Progress Notes (Signed)
 Subjective:  Patient ID: Micheal Keller, male    DOB: 1970-07-09  Age: 53 y.o. MRN: [id number]  CC: Hypertension and Hyperlipidemia   HPI [name] presents for f/up ---  Discussed the use of AI scribe software for clinical note transcription with the patient, who gave verbal consent to proceed.  History of Present Illness   The patient, with a history of hypertension, presented with concerns about elevated blood pressure readings. He reported that his blood pressure is usually around 130s/70s at home, but it was noted to be 150/90s during the current visit. 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 He denied experiencing any symptoms such as headache, blurred vision, chest pain, shortness of breath, dizziness, lightheadedness, or swelling in the legs or feet.  The patient also mentioned a change in medication timing due to dizziness experienced when taking one of his medications during the day. He did not specify the medication but reported that the dizziness resolved after switching the medication to nighttime.  In addition, the patient reported a significant lifestyle change, having stopped drinking alcohol for the past four months. He is also on a regimen of double diuretics, the continuation of which is pending a potassium level check.  Lastly, the patient requested a change in his prescription refill schedule, preferring a one-month supply at his local pharmacy and a longer supply through mail order. He also requested a refill of his sertraline  prescription.       Outpatient Medications Prior to Visit  Medication Sig Dispense Refill   DYMISTA 137-50 MCG/ACT SUSP      eletriptan  (RELPAX ) 40 MG tablet TAKE 1 TABLET AS NEEDED FORMIGRAINE MAY REPEAT IN 2   HOURS IF NECESSARY 10 tablet 2   fexofenadine  (ALLEGRA ) 180 MG tablet Take 180 mg by mouth daily.     Multiple Vitamin (MULTIVITAMIN ADULT PO) Take by mouth daily.     olopatadine  (PATADAY) 0.1 % ophthalmic solution Place 1 drop into both eyes daily.     topiramate  (TOPAMAX ) 50 MG tablet TAKE 1 TABLET BY MOUTH AT  BEDTIME 90 tablet 0   Bempedoic Acid-Ezetimibe  (NEXLIZET ) 180-10 MG TABS Take 1 tablet by mouth daily. 90 tablet 0   rosuvastatin  (CRESTOR ) 20 MG tablet TAKE 1 TABLET BY MOUTH DAILY 90 tablet 0   sertraline  (ZOLOFT ) 50 MG tablet TAKE 1 TABLET BY MOUTH DAILY 90 tablet 0   triamterene -hydrochlorothiazide  (DYAZIDE) 37.5-25 MG capsule Take 1 each (1 capsule total) by mouth daily. 90 capsule 0   No facility-administered medications prior to visit.    ROS Review of Systems  Constitutional:  Negative for chills, fatigue and fever.  HENT: Negative.    Eyes: Negative.   Respiratory: Negative.  Negative for cough, chest tightness, shortness of breath and wheezing.   Cardiovascular:  Negative for chest pain, palpitations and leg swelling.  Gastrointestinal: Negative.  Negative for abdominal pain, constipation, diarrhea, nausea and vomiting.  Genitourinary: Negative.  Negative for difficulty urinating.  Musculoskeletal:  Negative for arthralgias and myalgias.  Skin: Negative.   Neurological: Negative.  Negative for dizziness, weakness and light-headedness.  Hematological:  Negative for adenopathy. Does not bruise/bleed easily.  Psychiatric/Behavioral:  The patient is nervous/anxious.     Objective:  BP (!) 144/92 (BP Location: Left Arm, Patient Position: Sitting, Cuff Size: Large)   Pulse (!) 59   Temp 98.1 F (36.7 C) (Oral)   Resp 16   Ht 5' 11 (1.803 m)   Wt 214 lb  6.4 oz (97.3 kg)   SpO2 98%   BMI 29.90 kg/m   BP Readings from Last 3 Encounters:  07/20/23 (!) 144/92  05/08/23 (!) 134/90  04/16/23 (!) 156/94    Wt Readings from Last 3 Encounters:  07/20/23 214 lb 6.4 oz (97.3 kg)  04/16/23 213 lb (96.6 kg)  07/25/22 215 lb 9.6 oz (97.8 kg)    Physical Exam Vitals reviewed.  Constitutional:      Appearance: Normal appearance.  HENT:      Mouth/Throat:     Mouth: Mucous membranes are moist.  Eyes:     General: No scleral icterus.    Conjunctiva/sclera: Conjunctivae normal.  Cardiovascular:     Rate and Rhythm: Normal rate and regular rhythm.     Heart sounds: No murmur heard.    No friction rub. No gallop.  Pulmonary:     Effort: Pulmonary effort is normal.     Breath sounds: No stridor. No wheezing, rhonchi or rales.  Abdominal:     General: Abdomen is flat.     Palpations: There is no mass.     Tenderness: There is no abdominal tenderness. There is no guarding.     Hernia: No hernia is present.  Musculoskeletal:        General: Normal range of motion.     Cervical back: Neck supple.     Right lower leg: No edema.     Left lower leg: No edema.  Lymphadenopathy:     Cervical: No cervical adenopathy.  Skin:    General: Skin is warm and dry.  Neurological:     General: No focal deficit present.     Mental Status: He is alert.  Psychiatric:        Attention and Perception: He is inattentive.        Mood and Affect: Mood is anxious. Mood is not depressed or elated. Affect is not flat, tearful or inappropriate.        Speech: Speech normal. He is communicative. Speech is not delayed or tangential.        Behavior: Behavior normal.        Thought Content: Thought content normal.        Cognition and Memory: Cognition normal.        Judgment: Judgment normal.     Lab Results  Component Value Date   WBC 4.4 04/16/2023   HGB 13.9 04/16/2023   HCT 42.3 04/16/2023   PLT 244.0 04/16/2023   GLUCOSE 102 (H) 07/20/2023   CHOL 257 (H) 04/16/2023   TRIG 84.0 04/16/2023   HDL 42.80 04/16/2023   LDLCALC 198 (H) 04/16/2023   ALT 36 04/16/2023   AST 23 04/16/2023   NA 139 07/20/2023   K 3.8 07/20/2023   CL 103 07/20/2023   CREATININE 1.08 07/20/2023   BUN 10 07/20/2023   CO2 27 07/20/2023   TSH 1.59 07/16/2022   PSA 0.88 04/16/2023   HGBA1C 6.6 (H) 07/20/2023   MICROALBUR 0.7 04/16/2023    MR FINGERS LEFT  W/O CM Result Date: 08/03/2018 CLINICAL DATA:  Left long finger pain since the patient suffered an injury attempting to catch a football 2-1/2 months ago. Question volar plate injury at the PIP joint of the left long finger. Initial encounter. EXAM: MRI OF THE LEFT FINGERS WITHOUT CONTRAST TECHNIQUE: Multiplanar, multisequence MR imaging of the left fingers was performed. No intravenous contrast was administered. COMPARISON:  None. FINDINGS: Bones/Joint/Cartilage There is marrow edema in the neck of  the proximal phalanx of the long finger consistent with contusion. No fracture is identified. Ligaments The radial collateral ligament is torn from its attachment to the proximal phalanx of the long finger. The accessory collateral ligament on the radial side is also torn from the proximal phalanx of the long finger. The volar plate remains attached to the base of the middle phalanx of the long finger but the attachment is attenuated consistent with partial tear. The volar plate appears irregular and edematous. No pulley injury is identified. Muscles and Tendons Intact. Soft tissues Edema about the PIP joint of the long fingers worse on the radial side. IMPRESSION: Tears of both the radial collateral ligament and accessory radial collateral ligament from the proximal phalanx of the long finger. Partial tear of the volar plate from the base of the middle phalanx of the long finger. The volar plate is attenuated and edematous consistent with contusion. Edema in the distal aspect of the proximal phalanx of the long finger consistent with contusion. Electronically Signed   By: Debby Prader M.D.   On: 08/03/2018 10:01    Assessment & Plan:   Type 2 diabetes mellitus without complication, without long-term current use of insulin (HCC)- Blood sugar is adequately well controlled. -     Hemoglobin A1c; Future -     Basic metabolic panel; Future  Hyperlipidemia LDL goal <130- CK is elevated but he does not complain  of muscle pain. Will hold the lipid agents and recheck the CK in 1-2 weeks. -     Rosuvastatin  Calcium ; Take 1 tablet (20 mg total) by mouth daily.  Dispense: 90 tablet; Refill: 1 -     Nexlizet ; Take 1 tablet by mouth daily.  Dispense: 90 tablet; Refill: 1 -     CK; Future  Primary hypertension- He has not achieved his BP goal. Is working on his lifestyle modifications. -     Triamterene -HCTZ; Take 1 each (1 capsule total) by mouth daily.  Dispense: 30 capsule; Refill: 0 -     Basic metabolic panel; Future  Familial hypercholesterolemia -     Rosuvastatin  Calcium ; Take 1 tablet (20 mg total) by mouth daily.  Dispense: 90 tablet; Refill: 1 -     Nexlizet ; Take 1 tablet by mouth daily.  Dispense: 90 tablet; Refill: 1 -     CK; Future  Generalized anxiety disorder -     Sertraline  HCl; Take 1 tablet (50 mg total) by mouth daily.  Dispense: 90 tablet; Refill: 1     Follow-up: Return in about 6 months (around 01/17/2024).  Debby Molt, MD

## 2023-07-20 NOTE — Patient Instructions (Signed)
 Hypertension, Adult High blood pressure (hypertension) is when the force of blood pumping through the arteries is too strong. The arteries are the blood vessels that carry blood from the heart throughout the body. Hypertension forces the heart to work harder to pump blood and may cause arteries to become narrow or stiff. Untreated or uncontrolled hypertension can lead to a heart attack, heart failure, a stroke, kidney disease, and other problems. A blood pressure reading consists of a higher number over a lower number. Ideally, your blood pressure should be below 120/80. The first ("top") number is called the systolic pressure. It is a measure of the pressure in your arteries as your heart beats. The second ("bottom") number is called the diastolic pressure. It is a measure of the pressure in your arteries as the heart relaxes. What are the causes? The exact cause of this condition is not known. There are some conditions that result in high blood pressure. What increases the risk? Certain factors may make you more likely to develop high blood pressure. Some of these risk factors are under your control, including: Smoking. Not getting enough exercise or physical activity. Being overweight. Having too much fat, sugar, calories, or salt (sodium) in your diet. Drinking too much alcohol. Other risk factors include: Having a personal history of heart disease, diabetes, high cholesterol, or kidney disease. Stress. Having a family history of high blood pressure and high cholesterol. Having obstructive sleep apnea. Age. The risk increases with age. What are the signs or symptoms? High blood pressure may not cause symptoms. Very high blood pressure (hypertensive crisis) may cause: Headache. Fast or irregular heartbeats (palpitations). Shortness of breath. Nosebleed. Nausea and vomiting. Vision changes. Severe chest pain, dizziness, and seizures. How is this diagnosed? This condition is diagnosed by  measuring your blood pressure while you are seated, with your arm resting on a flat surface, your legs uncrossed, and your feet flat on the floor. The cuff of the blood pressure monitor will be placed directly against the skin of your upper arm at the level of your heart. Blood pressure should be measured at least twice using the same arm. Certain conditions can cause a difference in blood pressure between your right and left arms. If you have a high blood pressure reading during one visit or you have normal blood pressure with other risk factors, you may be asked to: Return on a different day to have your blood pressure checked again. Monitor your blood pressure at home for 1 week or longer. If you are diagnosed with hypertension, you may have other blood or imaging tests to help your health care provider understand your overall risk for other conditions. How is this treated? This condition is treated by making healthy lifestyle changes, such as eating healthy foods, exercising more, and reducing your alcohol intake. You may be referred for counseling on a healthy diet and physical activity. Your health care provider may prescribe medicine if lifestyle changes are not enough to get your blood pressure under control and if: Your systolic blood pressure is above 130. Your diastolic blood pressure is above 80. Your personal target blood pressure may vary depending on your medical conditions, your age, and other factors. Follow these instructions at home: Eating and drinking  Eat a diet that is high in fiber and potassium, and low in sodium, added sugar, and fat. An example of this eating plan is called the DASH diet. DASH stands for Dietary Approaches to Stop Hypertension. To eat this way: Eat  plenty of fresh fruits and vegetables. Try to fill one half of your plate at each meal with fruits and vegetables. Eat whole grains, such as whole-wheat pasta, brown rice, or whole-grain bread. Fill about one  fourth of your plate with whole grains. Eat or drink low-fat dairy products, such as skim milk or low-fat yogurt. Avoid fatty cuts of meat, processed or cured meats, and poultry with skin. Fill about one fourth of your plate with lean proteins, such as fish, chicken without skin, beans, eggs, or tofu. Avoid pre-made and processed foods. These tend to be higher in sodium, added sugar, and fat. Reduce your daily sodium intake. Many people with hypertension should eat less than 1,500 mg of sodium a day. Do not drink alcohol if: Your health care provider tells you not to drink. You are pregnant, may be pregnant, or are planning to become pregnant. If you drink alcohol: Limit how much you have to: 0-1 drink a day for women. 0-2 drinks a day for men. Know how much alcohol is in your drink. In the [place] one drink equals one 12 oz bottle of beer (355 mL), one 5 oz glass of wine (148 mL), or one 1 oz glass of hard liquor (44 mL). Lifestyle  Work with your health care provider to maintain a healthy body weight or to lose weight. Ask what an ideal weight is for you. Get at least 30 minutes of exercise that causes your heart to beat faster (aerobic exercise) most days of the week. Activities may include walking, swimming, or biking. Include exercise to strengthen your muscles (resistance exercise), such as Pilates or lifting weights, as part of your weekly exercise routine. Try to do these types of exercises for 30 minutes at least 3 days a week. Do not use any products that contain nicotine or tobacco. These products include cigarettes, chewing tobacco, and vaping devices, such as e-cigarettes. If you need help quitting, ask your health care provider. Monitor your blood pressure at home as told by your health care provider. Keep all follow-up visits. This is important. Medicines Take over-the-counter and prescription medicines only as told by your health care provider. Follow directions carefully. Blood  pressure medicines must be taken as prescribed. Do not skip doses of blood pressure medicine. Doing this puts you at risk for problems and can make the medicine less effective. Ask your health care provider about side effects or reactions to medicines that you should watch for. Contact a health care provider if you: Think you are having a reaction to a medicine you are taking. Have headaches that keep coming back (recurring). Feel dizzy. Have swelling in your ankles. Have trouble with your vision. Get help right away if you: Develop a severe headache or confusion. Have unusual weakness or numbness. Feel faint. Have severe pain in your chest or abdomen. Vomit repeatedly. Have trouble breathing. These symptoms may be an emergency. Get help right away. Call 911. Do not wait to see if the symptoms will go away. Do not drive yourself to the hospital. Summary Hypertension is when the force of blood pumping through your arteries is too strong. If this condition is not controlled, it may put you at risk for serious complications. Your personal target blood pressure may vary depending on your medical conditions, your age, and other factors. For most people, a normal blood pressure is less than 120/80. Hypertension is treated with lifestyle changes, medicines, or a combination of both. Lifestyle changes include losing weight, eating a healthy,  low-sodium diet, exercising more, and limiting alcohol. This information is not intended to replace advice given to you by your health care provider. Make sure you discuss any questions you have with your health care provider. Document Revised: 04/30/2021 Document Reviewed: 04/30/2021 Elsevier Patient Education  2024 [employer].

## 2023-07-20 NOTE — Progress Notes (Signed)
 His muscle enzyme is elevated Ask him to hold the cholesterol meds and come back on 1-2 weeks to recheck his muscle enzyme Stay hydrated

## 2023-07-22 ENCOUNTER — Other Ambulatory Visit: Payer: Self-pay | Admitting: Internal Medicine

## 2023-07-22 DIAGNOSIS — E7801 Familial hypercholesterolemia: Secondary | ICD-10-CM

## 2023-07-22 DIAGNOSIS — R748 Abnormal levels of other serum enzymes: Secondary | ICD-10-CM | POA: Insufficient documentation

## 2023-07-22 DIAGNOSIS — E785 Hyperlipidemia, unspecified: Secondary | ICD-10-CM

## 2023-07-27 ENCOUNTER — Other Ambulatory Visit: Payer: 59

## 2023-08-28 ENCOUNTER — Encounter: Payer: Self-pay | Admitting: Internal Medicine

## 2023-08-28 ENCOUNTER — Other Ambulatory Visit (INDEPENDENT_AMBULATORY_CARE_PROVIDER_SITE_OTHER): Payer: Self-pay

## 2023-08-28 DIAGNOSIS — E7801 Familial hypercholesterolemia: Secondary | ICD-10-CM

## 2023-08-28 DIAGNOSIS — R748 Abnormal levels of other serum enzymes: Secondary | ICD-10-CM

## 2023-08-28 DIAGNOSIS — E785 Hyperlipidemia, unspecified: Secondary | ICD-10-CM

## 2023-08-28 LAB — CK: Total CK: 135 U/L (ref 7–232)

## 2023-08-31 ENCOUNTER — Other Ambulatory Visit: Payer: Self-pay | Admitting: Pharmacist

## 2023-08-31 ENCOUNTER — Other Ambulatory Visit: Payer: Self-pay | Admitting: Internal Medicine

## 2023-08-31 DIAGNOSIS — I10 Essential (primary) hypertension: Secondary | ICD-10-CM

## 2023-08-31 MED ORDER — TRIAMTERENE-HCTZ 37.5-25 MG PO CAPS: 1.0000 | ORAL_CAPSULE | Freq: Every day | ORAL | 1 refills | Status: DC

## 2023-09-06 ENCOUNTER — Other Ambulatory Visit: Payer: Self-pay | Admitting: Internal Medicine

## 2023-09-06 DIAGNOSIS — G43809 Other migraine, not intractable, without status migrainosus: Secondary | ICD-10-CM

## 2023-09-10 ENCOUNTER — Other Ambulatory Visit: Payer: Self-pay | Admitting: Internal Medicine

## 2023-09-10 ENCOUNTER — Encounter: Payer: Self-pay | Admitting: Internal Medicine

## 2023-09-10 DIAGNOSIS — F411 Generalized anxiety disorder: Secondary | ICD-10-CM

## 2023-09-10 MED ORDER — SERTRALINE HCL 100 MG PO TABS: 100.0000 mg | ORAL_TABLET | Freq: Every day | ORAL | 0 refills | Status: DC

## 2023-10-08 ENCOUNTER — Telehealth: Payer: Self-pay

## 2023-10-08 NOTE — Telephone Encounter (Unsigned)
 Copied from CRM [id number]. Topic: General - Other >> Oct 08, 2023  1:42 PM Rodman Pickle T wrote: Reason for CRM: patient is needing a call back to see when he can come back in to get his second shingles vaccine

## 2023-10-09 NOTE — Telephone Encounter (Signed)
 LM for pt he can call our office to get 2nd shingles scheduled for any time as a nurse visit

## 2023-12-01 ENCOUNTER — Encounter: Payer: Self-pay | Admitting: Internal Medicine

## 2023-12-10 ENCOUNTER — Other Ambulatory Visit: Payer: Self-pay

## 2023-12-10 MED ORDER — ELETRIPTAN HYDROBROMIDE 40 MG PO TABS: ORAL_TABLET | ORAL | 0 refills | Status: AC

## 2024-03-02 ENCOUNTER — Encounter: Payer: Self-pay | Admitting: Internal Medicine

## 2024-04-05 ENCOUNTER — Encounter: Payer: Self-pay | Admitting: Internal Medicine

## 2024-04-05 ENCOUNTER — Ambulatory Visit (INDEPENDENT_AMBULATORY_CARE_PROVIDER_SITE_OTHER): Payer: PRIVATE HEALTH INSURANCE | Admitting: Internal Medicine

## 2024-04-05 VITALS — BP 152/102 | HR 72 | Temp 98.1°F | Resp 16 | Ht 71.0 in | Wt 210.6 lb

## 2024-04-05 DIAGNOSIS — H1045 Other chronic allergic conjunctivitis: Secondary | ICD-10-CM | POA: Insufficient documentation

## 2024-04-05 DIAGNOSIS — I119 Hypertensive heart disease without heart failure: Secondary | ICD-10-CM | POA: Diagnosis not present

## 2024-04-05 DIAGNOSIS — I10 Essential (primary) hypertension: Secondary | ICD-10-CM | POA: Diagnosis not present

## 2024-04-05 DIAGNOSIS — E78019 Familial hypercholesterolemia, unspecified: Secondary | ICD-10-CM

## 2024-04-05 DIAGNOSIS — R748 Abnormal levels of other serum enzymes: Secondary | ICD-10-CM

## 2024-04-05 DIAGNOSIS — J3081 Allergic rhinitis due to animal (cat) (dog) hair and dander: Secondary | ICD-10-CM | POA: Insufficient documentation

## 2024-04-05 DIAGNOSIS — T6391XA Toxic effect of contact with unspecified venomous animal, accidental (unintentional), initial encounter: Secondary | ICD-10-CM | POA: Insufficient documentation

## 2024-04-05 DIAGNOSIS — Z23 Encounter for immunization: Secondary | ICD-10-CM | POA: Diagnosis not present

## 2024-04-05 DIAGNOSIS — E119 Type 2 diabetes mellitus without complications: Secondary | ICD-10-CM | POA: Diagnosis not present

## 2024-04-05 DIAGNOSIS — E785 Hyperlipidemia, unspecified: Secondary | ICD-10-CM | POA: Diagnosis not present

## 2024-04-05 DIAGNOSIS — J301 Allergic rhinitis due to pollen: Secondary | ICD-10-CM | POA: Insufficient documentation

## 2024-04-05 DIAGNOSIS — E7801 Familial hypercholesterolemia: Secondary | ICD-10-CM

## 2024-04-05 LAB — HEPATIC FUNCTION PANEL
ALT: 24 U/L (ref 0–53)
AST: 26 U/L (ref 0–37)
Albumin: 4.8 g/dL (ref 3.5–5.2)
Alkaline Phosphatase: 55 U/L (ref 39–117)
Bilirubin, Direct: 0.1 mg/dL (ref 0.0–0.3)
Total Bilirubin: 0.5 mg/dL (ref 0.2–1.2)
Total Protein: 7.6 g/dL (ref 6.0–8.3)

## 2024-04-05 LAB — CBC WITH DIFFERENTIAL/PLATELET
Basophils Absolute: 0 K/uL (ref 0.0–0.1)
Basophils Relative: 0.9 % (ref 0.0–3.0)
Eosinophils Absolute: 0.1 K/uL (ref 0.0–0.7)
Eosinophils Relative: 2.1 % (ref 0.0–5.0)
HCT: 39.2 % (ref 39.0–52.0)
Hemoglobin: 13.3 g/dL (ref 13.0–17.0)
Lymphocytes Relative: 53.3 % — ABNORMAL HIGH (ref 12.0–46.0)
Lymphs Abs: 2.3 K/uL (ref 0.7–4.0)
MCHC: 33.8 g/dL (ref 30.0–36.0)
MCV: 91.3 fl (ref 78.0–100.0)
Monocytes Absolute: 0.3 K/uL (ref 0.1–1.0)
Monocytes Relative: 7.7 % (ref 3.0–12.0)
Neutro Abs: 1.6 K/uL (ref 1.4–7.7)
Neutrophils Relative %: 36 % — ABNORMAL LOW (ref 43.0–77.0)
Platelets: 285 K/uL (ref 150.0–400.0)
RBC: 4.29 Mil/uL (ref 4.22–5.81)
RDW: 13.2 % (ref 11.5–15.5)
WBC: 4.4 K/uL (ref 4.0–10.5)

## 2024-04-05 LAB — BASIC METABOLIC PANEL WITH GFR
BUN: 14 mg/dL (ref 6–23)
CO2: 27 meq/L (ref 19–32)
Calcium: 9.6 mg/dL (ref 8.4–10.5)
Chloride: 106 meq/L (ref 96–112)
Creatinine, Ser: 0.95 mg/dL (ref 0.40–1.50)
GFR: 92.69 mL/min (ref >60.00)
Glucose, Bld: 93 mg/dL (ref 70–99)
Potassium: 3.6 meq/L (ref 3.5–5.1)
Sodium: 141 meq/L (ref 135–145)

## 2024-04-05 LAB — URINALYSIS, ROUTINE W REFLEX MICROSCOPIC
Bilirubin Urine: NEGATIVE
Hgb urine dipstick: NEGATIVE
Ketones, ur: NEGATIVE
Leukocytes,Ua: NEGATIVE
Nitrite: NEGATIVE
RBC / HPF: NONE SEEN (ref 0–?)
Specific Gravity, Urine: 1.02 (ref 1.000–1.030)
Total Protein, Urine: NEGATIVE
Urine Glucose: NEGATIVE
Urobilinogen, UA: 1 (ref 0.0–1.0)
pH: 7 (ref 5.0–8.0)

## 2024-04-05 LAB — LIPID PANEL
Cholesterol: 228 mg/dL — ABNORMAL HIGH (ref 0–200)
HDL: 36.4 mg/dL — ABNORMAL LOW (ref >39.00)
LDL Cholesterol: 158 mg/dL — ABNORMAL HIGH (ref 0–99)
NonHDL: 191.14
Total CHOL/HDL Ratio: 6
Triglycerides: 165 mg/dL — ABNORMAL HIGH (ref 0.0–149.0)
VLDL: 33 mg/dL (ref 0.0–40.0)

## 2024-04-05 LAB — MICROALBUMIN / CREATININE URINE RATIO
Creatinine,U: 170.6 mg/dL
Microalb Creat Ratio: 7.3 mg/g (ref 0.0–30.0)
Microalb, Ur: 1.2 mg/dL (ref 0.0–1.9)

## 2024-04-05 LAB — CK: Total CK: 148 U/L (ref 17–232)

## 2024-04-05 LAB — HEMOGLOBIN A1C: Hgb A1c MFr Bld: 6.6 % — ABNORMAL HIGH (ref 4.6–6.5)

## 2024-04-05 LAB — TSH: TSH: 1.83 u[IU]/mL (ref 0.35–5.50)

## 2024-04-05 MED ORDER — SHINGRIX 50 MCG/0.5ML IM SUSR: 0.5000 mL | Freq: Once | INTRAMUSCULAR | 1 refills | Status: AC

## 2024-04-05 MED ORDER — PITAVASTATIN CALCIUM 2 MG PO TABS: 2.0000 mg | ORAL_TABLET | Freq: Every day | ORAL | 0 refills | Status: AC

## 2024-04-05 MED ORDER — NEXLIZET 180-10 MG PO TABS: 1.0000 | ORAL_TABLET | Freq: Every day | ORAL | 0 refills | Status: DC

## 2024-04-05 MED ORDER — TRIAMTERENE-HCTZ 37.5-25 MG PO CAPS: 1.0000 | ORAL_CAPSULE | Freq: Every day | ORAL | 0 refills | Status: AC

## 2024-04-05 NOTE — Patient Instructions (Signed)
 Hypertension, Adult High blood pressure (hypertension) is when the force of blood pumping through the arteries is too strong. The arteries are the blood vessels that carry blood from the heart throughout the body. Hypertension forces the heart to work harder to pump blood and may cause arteries to become narrow or stiff. Untreated or uncontrolled hypertension can lead to a heart attack, heart failure, a stroke, kidney disease, and other problems. A blood pressure reading consists of a higher number over a lower number. Ideally, your blood pressure should be below 120/80. The first ("top") number is called the systolic pressure. It is a measure of the pressure in your arteries as your heart beats. The second ("bottom") number is called the diastolic pressure. It is a measure of the pressure in your arteries as the heart relaxes. What are the causes? The exact cause of this condition is not known. There are some conditions that result in high blood pressure. What increases the risk? Certain factors may make you more likely to develop high blood pressure. Some of these risk factors are under your control, including: Smoking. Not getting enough exercise or physical activity. Being overweight. Having too much fat, sugar, calories, or salt (sodium) in your diet. Drinking too much alcohol. Other risk factors include: Having a personal history of heart disease, diabetes, high cholesterol, or kidney disease. Stress. Having a family history of high blood pressure and high cholesterol. Having obstructive sleep apnea. Age. The risk increases with age. What are the signs or symptoms? High blood pressure may not cause symptoms. Very high blood pressure (hypertensive crisis) may cause: Headache. Fast or irregular heartbeats (palpitations). Shortness of breath. Nosebleed. Nausea and vomiting. Vision changes. Severe chest pain, dizziness, and seizures. How is this diagnosed? This condition is diagnosed by  measuring your blood pressure while you are seated, with your arm resting on a flat surface, your legs uncrossed, and your feet flat on the floor. The cuff of the blood pressure monitor will be placed directly against the skin of your upper arm at the level of your heart. Blood pressure should be measured at least twice using the same arm. Certain conditions can cause a difference in blood pressure between your right and left arms. If you have a high blood pressure reading during one visit or you have normal blood pressure with other risk factors, you may be asked to: Return on a different day to have your blood pressure checked again. Monitor your blood pressure at home for 1 week or longer. If you are diagnosed with hypertension, you may have other blood or imaging tests to help your health care provider understand your overall risk for other conditions. How is this treated? This condition is treated by making healthy lifestyle changes, such as eating healthy foods, exercising more, and reducing your alcohol intake. You may be referred for counseling on a healthy diet and physical activity. Your health care provider may prescribe medicine if lifestyle changes are not enough to get your blood pressure under control and if: Your systolic blood pressure is above 130. Your diastolic blood pressure is above 80. Your personal target blood pressure may vary depending on your medical conditions, your age, and other factors. Follow these instructions at home: Eating and drinking  Eat a diet that is high in fiber and potassium, and low in sodium, added sugar, and fat. An example of this eating plan is called the DASH diet. DASH stands for Dietary Approaches to Stop Hypertension. To eat this way: Eat  plenty of fresh fruits and vegetables. Try to fill one half of your plate at each meal with fruits and vegetables. Eat whole grains, such as whole-wheat pasta, brown rice, or whole-grain bread. Fill about one  fourth of your plate with whole grains. Eat or drink low-fat dairy products, such as skim milk or low-fat yogurt. Avoid fatty cuts of meat, processed or cured meats, and poultry with skin. Fill about one fourth of your plate with lean proteins, such as fish, chicken without skin, beans, eggs, or tofu. Avoid pre-made and processed foods. These tend to be higher in sodium, added sugar, and fat. Reduce your daily sodium intake. Many people with hypertension should eat less than 1,500 mg of sodium a day. Do not drink alcohol if: Your health care provider tells you not to drink. You are pregnant, may be pregnant, or are planning to become pregnant. If you drink alcohol: Limit how much you have to: 0-1 drink a day for women. 0-2 drinks a day for men. Know how much alcohol is in your drink. In the [place] one drink equals one 12 oz bottle of beer (355 mL), one 5 oz glass of wine (148 mL), or one 1 oz glass of hard liquor (44 mL). Lifestyle  Work with your health care provider to maintain a healthy body weight or to lose weight. Ask what an ideal weight is for you. Get at least 30 minutes of exercise that causes your heart to beat faster (aerobic exercise) most days of the week. Activities may include walking, swimming, or biking. Include exercise to strengthen your muscles (resistance exercise), such as Pilates or lifting weights, as part of your weekly exercise routine. Try to do these types of exercises for 30 minutes at least 3 days a week. Do not use any products that contain nicotine or tobacco. These products include cigarettes, chewing tobacco, and vaping devices, such as e-cigarettes. If you need help quitting, ask your health care provider. Monitor your blood pressure at home as told by your health care provider. Keep all follow-up visits. This is important. Medicines Take over-the-counter and prescription medicines only as told by your health care provider. Follow directions carefully. Blood  pressure medicines must be taken as prescribed. Do not skip doses of blood pressure medicine. Doing this puts you at risk for problems and can make the medicine less effective. Ask your health care provider about side effects or reactions to medicines that you should watch for. Contact a health care provider if you: Think you are having a reaction to a medicine you are taking. Have headaches that keep coming back (recurring). Feel dizzy. Have swelling in your ankles. Have trouble with your vision. Get help right away if you: Develop a severe headache or confusion. Have unusual weakness or numbness. Feel faint. Have severe pain in your chest or abdomen. Vomit repeatedly. Have trouble breathing. These symptoms may be an emergency. Get help right away. Call 911. Do not wait to see if the symptoms will go away. Do not drive yourself to the hospital. Summary Hypertension is when the force of blood pumping through your arteries is too strong. If this condition is not controlled, it may put you at risk for serious complications. Your personal target blood pressure may vary depending on your medical conditions, your age, and other factors. For most people, a normal blood pressure is less than 120/80. Hypertension is treated with lifestyle changes, medicines, or a combination of both. Lifestyle changes include losing weight, eating a healthy,  low-sodium diet, exercising more, and limiting alcohol. This information is not intended to replace advice given to you by your health care provider. Make sure you discuss any questions you have with your health care provider. Document Revised: 04/30/2021 Document Reviewed: 04/30/2021 Elsevier Patient Education  2024 [employer].

## 2024-04-05 NOTE — Progress Notes (Unsigned)
 Subjective:  Patient ID: Micheal Keller, male    DOB: 1971-01-10  Age: 53 y.o. MRN: [id number]  CC: Hypertension, Hyperlipidemia, and Diabetes   HPI Micheal Keller presents for f/up --  Discussed the use of AI scribe software for clinical note transcription with the patient, who gave verbal consent to proceed.  History of Present Illness Micheal Keller is a 53 year old male with hypertension who presents with elevated blood pressure.  He has not taken his blood pressure medication for about a week and has not monitored his blood pressure during this time. Prior readings were around 140/87 mmHg. He feels a little off when standing up but has no new or worsening symptoms. No chest pain or shortness of breath.  He experiences blurred vision, which is consistent with his usual vision issues. His last eye exam was in December of the previous year. He spends a significant amount of time approximately two feet away from a computer screen.  He is a non-smoker but has cut back on smoking and drinks about a six-pack of alcohol over the weekend.     Outpatient Medications Prior to Visit  Medication Sig Dispense Refill   DYMISTA 137-50 MCG/ACT SUSP      eletriptan  (RELPAX ) 40 MG tablet TAKE 1 TABLET AS NEEDED FORMIGRAINE MAY REPEAT IN 2   HOURS IF NECESSARY 10 tablet 0   fexofenadine  (ALLEGRA ) 180 MG tablet Take 180 mg by mouth daily.     Multiple Vitamin (MULTIVITAMIN ADULT PO) Take by mouth daily.     olopatadine (PATADAY) 0.1 % ophthalmic solution Place 1 drop into both eyes daily.     sertraline  (ZOLOFT ) 100 MG tablet Take 1 tablet (100 mg total) by mouth daily. 90 tablet 0   topiramate  (TOPAMAX ) 50 MG tablet TAKE 1 TABLET BY MOUTH AT  BEDTIME 90 tablet 3   Bempedoic Acid-Ezetimibe  (NEXLIZET ) 180-10 MG TABS Take 1 tablet by mouth daily. 90 tablet 1   triamterene -hydrochlorothiazide  (DYAZIDE) 37.5-25 MG capsule Take 1 each (1 capsule total) by mouth daily. 90 capsule 1   No facility-administered  medications prior to visit.    ROS Review of Systems  Eyes:  Positive for visual disturbance.    Objective:  BP (!) 152/102 (BP Location: Left Arm, Patient Position: Sitting, Cuff Size: Normal)   Pulse 72   Temp 98.1 F (36.7 C) (Oral)   Resp 16   Ht 5' 11 (1.803 m)   Wt 210 lb 9.6 oz (95.5 kg)   SpO2 95%   BMI 29.37 kg/m   BP Readings from Last 3 Encounters:  04/05/24 (!) 152/102  07/20/23 (!) 144/92  05/08/23 (!) 134/90    Wt Readings from Last 3 Encounters:  04/05/24 210 lb 9.6 oz (95.5 kg)  07/20/23 214 lb 6.4 oz (97.3 kg)  04/16/23 213 lb (96.6 kg)    Physical Exam Cardiovascular:     Rate and Rhythm: Regular rhythm. Bradycardia present.     Comments: EKG----- SB, 56 bpm Minimal LVH Early repol unchanged Musculoskeletal:     Right lower leg: No edema.     Left lower leg: No edema.     Lab Results  Component Value Date   WBC 4.4 04/05/2024   HGB 13.3 04/05/2024   HCT 39.2 04/05/2024   PLT 285.0 04/05/2024   GLUCOSE 93 04/05/2024   CHOL 228 (H) 04/05/2024   TRIG 165.0 (H) 04/05/2024   HDL 36.40 (L) 04/05/2024   LDLCALC 158 (H) 04/05/2024   ALT 24 04/05/2024  AST 26 04/05/2024   NA 141 04/05/2024   K 3.6 04/05/2024   CL 106 04/05/2024   CREATININE 0.95 04/05/2024   BUN 14 04/05/2024   CO2 27 04/05/2024   TSH 1.83 04/05/2024   PSA 0.88 04/16/2023   HGBA1C 6.6 (H) 04/05/2024   MICROALBUR 1.2 04/05/2024    MR FINGERS LEFT W/O CM Result Date: 08/03/2018 CLINICAL DATA:  Left long finger pain since the patient suffered an injury attempting to catch a football 2-1/2 months ago. Question volar plate injury at the PIP joint of the left long finger. Initial encounter. EXAM: MRI OF THE LEFT FINGERS WITHOUT CONTRAST TECHNIQUE: Multiplanar, multisequence MR imaging of the left fingers was performed. No intravenous contrast was administered. COMPARISON:  None. FINDINGS: Bones/Joint/Cartilage There is marrow edema in the neck of the proximal phalanx of the  long finger consistent with contusion. No fracture is identified. Ligaments The radial collateral ligament is torn from its attachment to the proximal phalanx of the long finger. The accessory collateral ligament on the radial side is also torn from the proximal phalanx of the long finger. The volar plate remains attached to the base of the middle phalanx of the long finger but the attachment is attenuated consistent with partial tear. The volar plate appears irregular and edematous. No pulley injury is identified. Muscles and Tendons Intact. Soft tissues Edema about the PIP joint of the long fingers worse on the radial side. IMPRESSION: Tears of both the radial collateral ligament and accessory radial collateral ligament from the proximal phalanx of the long finger. Partial tear of the volar plate from the base of the middle phalanx of the long finger. The volar plate is attenuated and edematous consistent with contusion. Edema in the distal aspect of the proximal phalanx of the long finger consistent with contusion. Electronically Signed   By: Debby Prader M.D.   On: 08/03/2018 10:01    Assessment & Plan:  Hyperlipidemia LDL goal <130 -     Lipid panel; Future -     TSH; Future -     Hepatic function panel; Future -     Nexlizet ; Take 1 tablet by mouth daily.  Dispense: 90 tablet; Refill: 0 -     Pitavastatin  Calcium ; Take 1 tablet (2 mg total) by mouth daily.  Dispense: 90 tablet; Refill: 0 -     CT CARDIAC SCORING ([facility] LOCATIONS ONLY); Future -     AMB Referral [facility] Care Management  Primary hypertension -     TSH; Future -     Urinalysis, Routine w reflex microscopic; Future -     Hepatic function panel; Future -     CBC with Differential/Platelet; Future -     Basic metabolic panel with GFR; Future -     Aldosterone + renin activity w/ ratio; Future -     EKG 12-Lead -     Triamterene -HCTZ; Take 1 each (1 capsule total) by mouth daily.  Dispense: 90 capsule; Refill: 0 -     AMB  Referral [facility]I Care Management  Elevated CK -     Lipid panel; Future -     CK; Future  Hypertensive left ventricular hypertrophy, without heart failure -     AMB Referral [facility] Care Management  Flu vaccine need -     Flu vaccine trivalent PF, 6mos and older(Flulaval,Afluria,Fluarix,Fluzone)  Familial hypercholesterolemia -     Nexlizet ; Take 1 tablet by mouth daily.  Dispense: 90 tablet; Refill: 0 -  Pitavastatin  Calcium ; Take 1 tablet (2 mg total) by mouth daily.  Dispense: 90 tablet; Refill: 0 -     CT CARDIAC SCORING ([facility] LOCATIONS ONLY); Future -     AMB Referral [facility] Care Management  Type 2 diabetes mellitus without complication, without long-term current use of insulin (HCC) -     Urinalysis, Routine w reflex microscopic; Future -     Hemoglobin A1c; Future -     Microalbumin / creatinine urine ratio; Future -     Basic metabolic panel with GFR; Future -     CT CARDIAC SCORING ([facility] LOCATIONS ONLY); Future -     AMB Referral [facility]I Care Management  Immunization due -     Pneumococcal conjugate vaccine 20-valent  Need for prophylactic vaccination and inoculation against varicella -     Shingrix ; Inject 0.5 mLs into the muscle once for 1 dose.  Dispense: 0.5 mL; Refill: 1     Follow-up: Return in about 3 months (around 07/05/2024).  Debby Molt, MD

## 2024-04-06 ENCOUNTER — Ambulatory Visit: Payer: Self-pay | Admitting: Internal Medicine

## 2024-04-06 MED ORDER — AMLODIPINE BESYLATE 5 MG PO TABS: 5.0000 mg | ORAL_TABLET | Freq: Every day | ORAL | 0 refills | Status: DC

## 2024-04-08 ENCOUNTER — Telehealth: Payer: Self-pay | Admitting: *Deleted

## 2024-04-08 NOTE — Progress Notes (Signed)
 Care Guide Pharmacy Note  04/08/2024 Name: Micheal Keller MRN: [id number] DOB: 08-30-1970  Referred By: Joshua Debby CROME, MD Reason for referral: Call Attempt #1 and Complex Care Management (Outreach to schedule referral with pharmacist )   Micheal Keller is a 53 y.o. year old male who is a primary care patient of Joshua Debby CROME, MD.  Micheal Keller was referred to the pharmacist for assistance related to: DMII  An unsuccessful telephone outreach was attempted today to contact the patient who was referred to the pharmacy team for assistance with medication management. Additional attempts will be made to contact the patient.  Micheal Keller, CMA [place]  [facility], [facility] Guide Direct Dial: [phone]  Fax: [phone] Website: [email or web address]

## 2024-04-11 ENCOUNTER — Ambulatory Visit
Admission: RE | Admit: 2024-04-11 | Discharge: 2024-04-11 | Disposition: A | Discharge status: Home / Self Care | Payer: PRIVATE HEALTH INSURANCE | Source: Ambulatory Visit | Attending: Internal Medicine | Admitting: Internal Medicine

## 2024-04-11 DIAGNOSIS — E78019 Familial hypercholesterolemia, unspecified: Secondary | ICD-10-CM

## 2024-04-11 DIAGNOSIS — E785 Hyperlipidemia, unspecified: Secondary | ICD-10-CM

## 2024-04-11 DIAGNOSIS — E119 Type 2 diabetes mellitus without complications: Secondary | ICD-10-CM

## 2024-04-11 NOTE — Progress Notes (Unsigned)
 Care Guide Pharmacy Note  04/11/2024 Name: Micheal Keller MRN: [id number] DOB: Jan 03, 1971  Referred By: Joshua Debby CROME, MD Reason for referral: Call Attempt #1 and Complex Care Management (Outreach to schedule referral with pharmacist )   Micheal Keller is a 53 y.o. year old male who is a primary care patient of Joshua Debby CROME, MD.  Micheal Keller was referred to the pharmacist for assistance related to: DMII  A second unsuccessful telephone outreach was attempted today to contact the patient who was referred to the pharmacy team for assistance with medication management. Additional attempts will be made to contact the patient.  Micheal Keller, CMA [place]  [facility], [facility] Guide Direct Dial: [phone]  Fax: [phone] Website: [email or web address]

## 2024-04-12 NOTE — Progress Notes (Signed)
 Care Guide Pharmacy Note  04/12/2024 Name: Micheal Keller MRN: [id number] DOB: 08-Dec-1970  Referred By: Micheal Debby CROME, MD Reason for referral: Call Attempt #1 and Complex Care Management (Outreach to schedule referral with pharmacist )   Micheal Keller is a 53 y.o. year old male who is a primary care patient of Micheal Debby CROME, MD.  Micheal Keller was referred to the pharmacist for assistance related to: DMII  A third unsuccessful telephone outreach was attempted today to contact the patient who was referred to the pharmacy team for assistance with medication management. The Population Health team is pleased to engage with this patient at any time in the future upon receipt of referral and should he/she be interested in assistance from the Population Health team.  Micheal Keller, CMA [facility]  [facility], [facility] Guide Direct Dial: [phone]  Fax: [phone] Website: [email or web address]

## 2024-04-18 LAB — ALDOSTERONE + RENIN ACTIVITY W/ RATIO
ALDO / PRA Ratio: 12.2 ratio (ref 0.9–28.9)
Aldosterone: 6 ng/dL
Renin Activity: 0.49 ng/mL/h (ref 0.25–5.82)

## 2024-06-01 ENCOUNTER — Other Ambulatory Visit: Payer: Self-pay | Admitting: Internal Medicine

## 2024-06-01 DIAGNOSIS — F411 Generalized anxiety disorder: Secondary | ICD-10-CM

## 2024-07-05 ENCOUNTER — Ambulatory Visit: Payer: PRIVATE HEALTH INSURANCE | Admitting: Internal Medicine

## 2024-07-05 ENCOUNTER — Encounter: Payer: Self-pay | Admitting: Internal Medicine

## 2024-07-05 VITALS — BP 136/88 | HR 58 | Temp 98.4°F | Resp 16 | Ht 71.0 in | Wt 213.8 lb

## 2024-07-05 DIAGNOSIS — Z0000 Encounter for general adult medical examination without abnormal findings: Secondary | ICD-10-CM | POA: Diagnosis not present

## 2024-07-05 DIAGNOSIS — Z0001 Encounter for general adult medical examination with abnormal findings: Secondary | ICD-10-CM

## 2024-07-05 DIAGNOSIS — R748 Abnormal levels of other serum enzymes: Secondary | ICD-10-CM

## 2024-07-05 DIAGNOSIS — E119 Type 2 diabetes mellitus without complications: Secondary | ICD-10-CM

## 2024-07-05 DIAGNOSIS — E785 Hyperlipidemia, unspecified: Secondary | ICD-10-CM

## 2024-07-05 DIAGNOSIS — Z23 Encounter for immunization: Secondary | ICD-10-CM

## 2024-07-05 DIAGNOSIS — I10 Essential (primary) hypertension: Secondary | ICD-10-CM | POA: Diagnosis not present

## 2024-07-05 LAB — BASIC METABOLIC PANEL WITH GFR
BUN: 12 mg/dL (ref 6–23)
CO2: 29 meq/L (ref 19–32)
Calcium: 9.5 mg/dL (ref 8.4–10.5)
Chloride: 103 meq/L (ref 96–112)
Creatinine, Ser: 0.99 mg/dL (ref 0.40–1.50)
GFR: 88.06 mL/min
Glucose, Bld: 113 mg/dL — ABNORMAL HIGH (ref 70–99)
Potassium: 4 meq/L (ref 3.5–5.1)
Sodium: 140 meq/L (ref 135–145)

## 2024-07-05 LAB — PSA: PSA: 0.88 ng/mL (ref 0.10–4.00)

## 2024-07-05 LAB — HEMOGLOBIN A1C: Hgb A1c MFr Bld: 6.3 % (ref 4.6–6.5)

## 2024-07-05 LAB — CK: Total CK: 165 U/L (ref 17–232)

## 2024-07-05 MED ORDER — COVID-19 MRNA VAC-TRIS(PFIZER) 30 MCG/0.3ML IM SUSY: 0.3000 mL | PREFILLED_SYRINGE | Freq: Once | INTRAMUSCULAR | 0 refills | Status: AC

## 2024-07-05 NOTE — Patient Instructions (Signed)
 Health Maintenance, Male  Adopting a healthy lifestyle and getting preventive care are important in promoting health and wellness. Ask your health care provider about:  The right schedule for you to have regular tests and exams.  Things you can do on your own to prevent diseases and keep yourself healthy.  What should I know about diet, weight, and exercise?  Eat a healthy diet    Eat a diet that includes plenty of vegetables, fruits, low-fat dairy products, and lean protein.  Do not eat a lot of foods that are high in solid fats, added sugars, or sodium.  Maintain a healthy weight  Body mass index (BMI) is a measurement that can be used to identify possible weight problems. It estimates body fat based on height and weight. Your health care provider can help determine your BMI and help you achieve or maintain a healthy weight.  Get regular exercise  Get regular exercise. This is one of the most important things you can do for your health. Most adults should:  Exercise for at least 150 minutes each week. The exercise should increase your heart rate and make you sweat (moderate-intensity exercise).  Do strengthening exercises at least twice a week. This is in addition to the moderate-intensity exercise.  Spend less time sitting. Even light physical activity can be beneficial.  Watch cholesterol and blood lipids  Have your blood tested for lipids and cholesterol at 51 years of age, then have this test every 5 years.  You may need to have your cholesterol levels checked more often if:  Your lipid or cholesterol levels are high.  You are older than 51 years of age.  You are at high risk for heart disease.  What should I know about cancer screening?  Many types of cancers can be detected early and may often be prevented. Depending on your health history and family history, you may need to have cancer screening at various ages. This may include screening for:  Colorectal cancer.  Prostate cancer.  Skin cancer.  Lung  cancer.  What should I know about heart disease, diabetes, and high blood pressure?  Blood pressure and heart disease  High blood pressure causes heart disease and increases the risk of stroke. This is more likely to develop in people who have high blood pressure readings or are overweight.  Talk with your health care provider about your target blood pressure readings.  Have your blood pressure checked:  Every 3-5 years if you are 24-52 years of age.  Every year if you are 3 years old or older.  If you are between the ages of 60 and 72 and are a current or former smoker, ask your health care provider if you should have a one-time screening for abdominal aortic aneurysm (AAA).  Diabetes  Have regular diabetes screenings. This checks your fasting blood sugar level. Have the screening done:  Once every three years after age 66 if you are at a normal weight and have a low risk for diabetes.  More often and at a younger age if you are overweight or have a high risk for diabetes.  What should I know about preventing infection?  Hepatitis B  If you have a higher risk for hepatitis B, you should be screened for this virus. Talk with your health care provider to find out if you are at risk for hepatitis B infection.  Hepatitis C  Blood testing is recommended for:  Everyone born from [birth year] through 1963.  Anyone  with known risk factors for hepatitis C.  Sexually transmitted infections (STIs)  You should be screened each year for STIs, including gonorrhea and chlamydia, if:  You are sexually active and are younger than 51 years of age.  You are older than 51 years of age and your health care provider tells you that you are at risk for this type of infection.  Your sexual activity has changed since you were last screened, and you are at increased risk for chlamydia or gonorrhea. Ask your health care provider if you are at risk.  Ask your health care provider about whether you are at high risk for HIV. Your health care provider  may recommend a prescription medicine to help prevent HIV infection. If you choose to take medicine to prevent HIV, you should first get tested for HIV. You should then be tested every 3 months for as long as you are taking the medicine.  Follow these instructions at home:  Alcohol use  Do not drink alcohol if your health care provider tells you not to drink.  If you drink alcohol:  Limit how much you have to 0-2 drinks a day.  Know how much alcohol is in your drink. In the [place] one drink equals one 12 oz bottle of beer (355 mL), one 5 oz glass of wine (148 mL), or one 1 oz glass of hard liquor (44 mL).  Lifestyle  Do not use any products that contain nicotine or tobacco. These products include cigarettes, chewing tobacco, and vaping devices, such as e-cigarettes. If you need help quitting, ask your health care provider.  Do not use street drugs.  Do not share needles.  Ask your health care provider for help if you need support or information about quitting drugs.  General instructions  Schedule regular health, dental, and eye exams.  Stay current with your vaccines.  Tell your health care provider if:  You often feel depressed.  You have ever been abused or do not feel safe at home.  Summary  Adopting a healthy lifestyle and getting preventive care are important in promoting health and wellness.  Follow your health care provider's instructions about healthy diet, exercising, and getting tested or screened for diseases.  Follow your health care provider's instructions on monitoring your cholesterol and blood pressure.  This information is not intended to replace advice given to you by your health care provider. Make sure you discuss any questions you have with your health care provider.  Document Revised: 11/12/2020 Document Reviewed: 11/12/2020  Elsevier Patient Education  2024 [employer].

## 2024-07-05 NOTE — Progress Notes (Unsigned)
 "  Subjective:  Patient ID: Micheal Keller, male    DOB: [birth year]  Age: 53 y.o. MRN: [id number]  CC: Hypertension, Hyperlipidemia, Diabetes, and Annual Exam   HPI Micheal Keller presents for a CPX and f/up --  Discussed the use of AI scribe software for clinical note transcription with the patient, who gave verbal consent to proceed.  History of Present Illness Micheal Keller is a 53 year old male who presents for an annual physical exam.  He feels generally well and maintains physical activity by walking. His job requires prolonged sitting in front of a computer. No chest pain, shortness of breath, dizziness, or lightheadedness during physical activity. Occasionally experiences lightheadedness when standing up quickly, which he attributes to his blood pressure.  No symptoms of hyperglycemia such as excessive thirst or urination. No changes in weight or appetite. His last eye exam was approximately one year ago.  No muscle or joint aches. No new developments in family history, including cancers, heart attacks, or strokes. He recalls being vaccinated against hepatitis B about fifteen years ago.     Outpatient Medications Prior to Visit  Medication Sig Dispense Refill   amLODipine  (NORVASC ) 5 MG tablet Take 1 tablet (5 mg total) by mouth daily. 90 tablet 0   Bempedoic Acid-Ezetimibe  (NEXLIZET ) 180-10 MG TABS Take 1 tablet by mouth daily. 90 tablet 0   DYMISTA 137-50 MCG/ACT SUSP      eletriptan  (RELPAX ) 40 MG tablet TAKE 1 TABLET AS NEEDED FORMIGRAINE MAY REPEAT IN 2   HOURS IF NECESSARY 10 tablet 0   fexofenadine  (ALLEGRA ) 180 MG tablet Take 180 mg by mouth daily.     Multiple Vitamin (MULTIVITAMIN ADULT PO) Take by mouth daily.     olopatadine (PATADAY) 0.1 % ophthalmic solution Place 1 drop into both eyes daily.     Pitavastatin  Calcium  2 MG TABS Take 1 tablet (2 mg total) by mouth daily. 90 tablet 0   sertraline  (ZOLOFT ) 100 MG tablet TAKE ONE TABLET BY MOUTH ONCE DAILY 90 tablet 0    topiramate  (TOPAMAX ) 50 MG tablet TAKE 1 TABLET BY MOUTH AT  BEDTIME 90 tablet 3   triamterene -hydrochlorothiazide  (DYAZIDE) 37.5-25 MG capsule Take 1 each (1 capsule total) by mouth daily. 90 capsule 0   No facility-administered medications prior to visit.    ROS Review of Systems  Constitutional:  Negative for appetite change, chills, diaphoresis, fatigue and fever.  HENT: Negative.  Negative for trouble swallowing.   Eyes: Negative.   Respiratory: Negative.  Negative for chest tightness, shortness of breath and wheezing.   Cardiovascular:  Negative for chest pain, palpitations and leg swelling.  Gastrointestinal:  Negative for abdominal pain, constipation, diarrhea, nausea and vomiting.  Endocrine: Negative.   Genitourinary:  Negative for difficulty urinating, penile pain, penile swelling and scrotal swelling.  Musculoskeletal: Negative.  Negative for arthralgias and myalgias.  Skin: Negative.   Neurological: Negative.  Negative for dizziness and weakness.  Hematological:  Negative for adenopathy. Does not bruise/bleed easily.  Psychiatric/Behavioral:  The patient is nervous/anxious.     Objective:  BP 136/88 (BP Location: Left Arm, Patient Position: Sitting, Cuff Size: Normal)   Pulse (!) 58   Temp 98.4 F (36.9 C) (Oral)   Resp 16   Ht 5' 11 (1.803 m)   Wt 213 lb 12.8 oz (97 kg)   SpO2 97%   BMI 29.82 kg/m   BP Readings from Last 3 Encounters:  07/05/24 136/88  04/05/24 (!) 152/102  07/20/23 (!) 144/92  Wt Readings from Last 3 Encounters:  07/05/24 213 lb 12.8 oz (97 kg)  04/05/24 210 lb 9.6 oz (95.5 kg)  07/20/23 214 lb 6.4 oz (97.3 kg)    Physical Exam Vitals reviewed.  Constitutional:      Appearance: Normal appearance.  HENT:     Nose: Nose normal.     Mouth/Throat:     Mouth: Mucous membranes are moist.  Eyes:     General: No scleral icterus.    Conjunctiva/sclera: Conjunctivae normal.  Cardiovascular:     Rate and Rhythm: Regular rhythm.  Bradycardia present.     Pulses: Normal pulses.     Heart sounds: No murmur heard.    No friction rub. No gallop.  Pulmonary:     Effort: Pulmonary effort is normal.     Breath sounds: No stridor. No wheezing, rhonchi or rales.  Abdominal:     General: Abdomen is flat. Bowel sounds are normal.     Palpations: There is no mass.     Tenderness: There is no abdominal tenderness. There is no guarding.     Hernia: No hernia is present. There is no hernia in the left inguinal area or right inguinal area.  Genitourinary:    Pubic Area: No rash.      Penis: Normal and uncircumcised.      Testes: Normal.     Epididymis:     Right: Normal.     Left: Normal.     Prostate: Normal. Not enlarged, not tender and no nodules present.     Rectum: Normal. Guaiac result negative. No mass, tenderness, anal fissure, external hemorrhoid or internal hemorrhoid. Normal anal tone.  Musculoskeletal:        General: Normal range of motion.     Cervical back: Neck supple.     Right lower leg: No edema.     Left lower leg: No edema.  Lymphadenopathy:     Cervical: No cervical adenopathy.     Lower Body: No right inguinal adenopathy. No left inguinal adenopathy.  Skin:    General: Skin is warm and dry.  Neurological:     General: No focal deficit present.     Mental Status: He is alert. Mental status is at baseline.  Psychiatric:        Attention and Perception: Attention normal.        Mood and Affect: Affect normal. Mood is anxious.        Speech: Speech normal.        Behavior: Behavior normal.        Thought Content: Thought content normal.        Cognition and Memory: Cognition normal.     Lab Results  Component Value Date   WBC 4.4 04/05/2024   HGB 13.3 04/05/2024   HCT 39.2 04/05/2024   PLT 285.0 04/05/2024   GLUCOSE 113 (H) 07/05/2024   CHOL 228 (H) 04/05/2024   TRIG 165.0 (H) 04/05/2024   HDL 36.40 (L) 04/05/2024   LDLCALC 158 (H) 04/05/2024   ALT 24 04/05/2024   AST 26 04/05/2024    NA 140 07/05/2024   K 4.0 07/05/2024   CL 103 07/05/2024   CREATININE 0.99 07/05/2024   BUN 12 07/05/2024   CO2 29 07/05/2024   TSH 1.83 04/05/2024   PSA 0.88 07/05/2024   HGBA1C 6.3 07/05/2024   MICROALBUR 1.2 04/05/2024    CT CARDIAC SCORING ([facility] LOCATIONS ONLY) Result Date: 04/11/2024 CLINICAL DATA:  53 year old black male with hyperlipidemia. Familial  hypercholesterolemia. * Tracking Code: FCC * EXAM: CT CARDIAC CORONARY ARTERY CALCIUM  SCORE TECHNIQUE: Non-contrast imaging through the heart was performed using prospective ECG gating. Image post processing was performed on an independent workstation, allowing for quantitative analysis of the heart and coronary arteries. Note that this exam targets the heart and the chest was not imaged in its entirety. COMPARISON:  None Available. FINDINGS: CORONARY CALCIUM  SCORES: Left Main: 0 LAD: 0 LCx: 0 RCA: 0 Total Agatston Score: 0 MESA database percentile: 0 AORTA MEASUREMENTS: Ascending Aorta: 3.7 cm Descending Aorta: 3.2 cm OTHER FINDINGS: Heart size is normal. No significant pericardial fluid. Visualized mediastinal structures are normal. Images of the upper abdomen are unremarkable. No airspace disease or consolidation in the visualized lungs. No acute bone abnormality. IMPRESSION: Coronary calcium  score is 0. Electronically Signed   By: Juliene Balder M.D.   On: 04/11/2024 13:19    The 10-year ASCVD risk score (Arnett DK, et al., 2019) is: 21.1%   Values used to calculate the score:     Age: 38 years     Clinically relevant sex: Male     Is Non-Hispanic African American: Yes     Diabetic: Yes     Tobacco smoker: No     Systolic Blood Pressure: 136 mmHg     Is BP treated: Yes     HDL Cholesterol: 36.4 mg/dL     Total Cholesterol: 228 mg/dL   Assessment & Plan:  Primary hypertension- BP is well controlled. -     Basic metabolic panel with GFR; Future  Encounter for general adult medical examination with abnormal findings- Exam completed,  labs reviewed, vaccines reviewed and updated, cancer screenings addressed, pt ed material was given.  -     PSA; Future  Elevated CK -     CK; Future  Type 2 diabetes mellitus without complication, without long-term current use of insulin (HCC)- Blood sugar is well controlled. -     Hemoglobin A1c; Future -     Basic metabolic panel with GFR; Future -     Ambulatory referral to Ophthalmology -     COVID-19 mRNA Vac-TriS(Pfizer); Inject 0.3 mLs into the muscle once for 1 dose.  Dispense: 0.3 mL; Refill: 0 -     Hepatitis B surface antibody,quantitative; Future -     [facility] Diabetes Foot Exam  Hyperlipidemia LDL goal <130 -     CK; Future -     Lipid panel; Future  Immunization due -     Varicella-zoster vaccine IM     Follow-up: Return in about 6 months (around 01/03/2025).  Debby Molt, MD "

## 2024-07-06 ENCOUNTER — Ambulatory Visit: Payer: Self-pay | Admitting: Internal Medicine

## 2024-07-06 LAB — HEPATITIS B SURFACE ANTIBODY, QUANTITATIVE: Hep B S AB Quant (Post): <5 m[IU]/mL — ABNORMAL LOW

## 2024-07-13 ENCOUNTER — Other Ambulatory Visit: Payer: Self-pay | Admitting: Internal Medicine

## 2024-07-13 DIAGNOSIS — I10 Essential (primary) hypertension: Secondary | ICD-10-CM

## 2024-07-13 DIAGNOSIS — I119 Hypertensive heart disease without heart failure: Secondary | ICD-10-CM

## 2024-08-08 ENCOUNTER — Other Ambulatory Visit: Payer: Self-pay | Admitting: Internal Medicine

## 2024-08-08 DIAGNOSIS — E78019 Familial hypercholesterolemia, unspecified: Secondary | ICD-10-CM

## 2024-08-08 DIAGNOSIS — E785 Hyperlipidemia, unspecified: Secondary | ICD-10-CM
